# Patient Record
Sex: Female | Born: 1995 | Race: Black or African American | Hispanic: No | Smoking: Never smoker
Health system: Southern US, Community
[De-identification: ages and names within clinical notes are randomized; demographics above are authoritative.]

## PROBLEM LIST (undated history)

## (undated) ENCOUNTER — Inpatient Hospital Stay (HOSPITAL_COMMUNITY): Payer: Self-pay

## (undated) DIAGNOSIS — D649 Anemia, unspecified: Secondary | ICD-10-CM

## (undated) DIAGNOSIS — J020 Streptococcal pharyngitis: Secondary | ICD-10-CM

## (undated) HISTORY — DX: Streptococcal pharyngitis: J02.0

---

## 2017-11-29 LAB — CULTURE, OB URINE: URINE CULTURE, OB: NEGATIVE

## 2017-11-29 LAB — OB RESULTS CONSOLE RPR: RPR: NONREACTIVE

## 2017-11-29 LAB — OB RESULTS CONSOLE HIV ANTIBODY (ROUTINE TESTING): HIV: NONREACTIVE

## 2017-11-29 LAB — OB RESULTS CONSOLE HGB/HCT, BLOOD
HCT: 30 (ref 29–41)
Hemoglobin: 9.7

## 2017-11-29 LAB — OB RESULTS CONSOLE ABO/RH: RH Type: POSITIVE

## 2017-11-29 LAB — OB RESULTS CONSOLE PLATELET COUNT: Platelets: 262

## 2017-11-29 LAB — OB RESULTS CONSOLE RUBELLA ANTIBODY, IGM: Rubella: IMMUNE

## 2017-11-29 LAB — OB RESULTS CONSOLE HEPATITIS B SURFACE ANTIGEN: Hepatitis B Surface Ag: NEGATIVE

## 2017-11-29 LAB — OB RESULTS CONSOLE ANTIBODY SCREEN: Antibody Screen: NEGATIVE

## 2018-02-09 NOTE — L&D Delivery Note (Signed)
Delivery Note FHR w/some late decels, moderate variability. Cx 9/100/0. Pt encouraged to push and cx easily reduced.  A few contractions later, at 12:27 AM a viable female was delivered via Vaginal, Spontaneous (Presentation:LOA  ).  APGAR: 8/9; weight  Pending. After 1 minute, the cord was clamped and cut.  The placenta separated spontaneously and delivered via CCT and maternal pushing effort.  It was inspected and appears to be intact with a 3 VC. 40 units of pitocin diluted in 1000cc LR was infused rapidly IV. Anesthesia:  epidural Episiotomy: None Lacerations: None Suture Repair:  Est. Blood Loss (mL): 151  Mom to postpartum.  Baby to Couplet care / Skin to Skin.  Erin Montes 06/05/2018, 12:47 AM

## 2018-03-15 LAB — GLUCOSE, 1 HOUR: Glucose, GTT - 1 Hour: 95 (ref ?–200)

## 2018-03-15 LAB — OB RESULTS CONSOLE HGB/HCT, BLOOD
HCT: 27 — AB (ref 29–41)
Hemoglobin: 8.5

## 2018-03-15 LAB — OB RESULTS CONSOLE PLATELET COUNT: Platelets: 198

## 2018-05-12 ENCOUNTER — Telehealth: Payer: Self-pay | Admitting: Family Medicine

## 2018-05-12 NOTE — Telephone Encounter (Signed)
Called the patient to inform of the upcoming appointment. Left the patient a voicemail message stating to give our clinic a call back.

## 2018-05-14 ENCOUNTER — Encounter (HOSPITAL_COMMUNITY): Payer: Self-pay

## 2018-05-14 ENCOUNTER — Other Ambulatory Visit: Payer: Self-pay

## 2018-05-14 ENCOUNTER — Inpatient Hospital Stay (HOSPITAL_COMMUNITY)
Admission: EM | Admit: 2018-05-14 | Discharge: 2018-05-15 | Disposition: A | Discharge status: Home / Self Care | Payer: Medicaid - Out of State | Attending: Obstetrics and Gynecology | Admitting: Obstetrics and Gynecology

## 2018-05-14 DIAGNOSIS — Z3689 Encounter for other specified antenatal screening: Secondary | ICD-10-CM

## 2018-05-14 DIAGNOSIS — O26893 Other specified pregnancy related conditions, third trimester: Secondary | ICD-10-CM | POA: Diagnosis not present

## 2018-05-14 DIAGNOSIS — O479 False labor, unspecified: Secondary | ICD-10-CM

## 2018-05-14 DIAGNOSIS — Z3A36 36 weeks gestation of pregnancy: Secondary | ICD-10-CM | POA: Insufficient documentation

## 2018-05-14 DIAGNOSIS — R8290 Unspecified abnormal findings in urine: Secondary | ICD-10-CM | POA: Insufficient documentation

## 2018-05-14 DIAGNOSIS — O4703 False labor before 37 completed weeks of gestation, third trimester: Secondary | ICD-10-CM | POA: Insufficient documentation

## 2018-05-14 HISTORY — DX: Anemia, unspecified: D64.9

## 2018-05-14 LAB — URINALYSIS, ROUTINE W REFLEX MICROSCOPIC
Bilirubin Urine: NEGATIVE
Glucose, UA: 50 mg/dL — AB
Ketones, ur: NEGATIVE mg/dL
Nitrite: NEGATIVE
Protein, ur: NEGATIVE mg/dL
Specific Gravity, Urine: 1.015 (ref 1.005–1.030)
pH: 6 (ref 5.0–8.0)

## 2018-05-14 NOTE — MAU Provider Note (Signed)
  S: [name] is a 23 y.o. G3P2002 at [redacted]w[redacted]d  who presents to MAU today complaining contractions q ten minutes since 4pm. She endorses scant pink-tinged discharge twice today after voiding. She denies LOF. She reports normal fetal movement.    O: BP 129/82 (BP Location: Right Arm)   Pulse 83   Temp 98.4 F (36.9 C) (Oral)   Resp 16   Ht 5\' 4"  (1.626 m)   LMP 08/29/2017 (Exact Date)   SpO2 97% Comment: room air  GENERAL: Well-developed, well-nourished female in no acute distress.  HEAD: Normocephalic, atraumatic.  CHEST: Normal effort of breathing, regular heart rate ABDOMEN: Soft, nontender, gravid  Cervical exam:  Dilation: 1 Effacement (%): 70 Cervical Position: Posterior Station: Ballotable Presentation: Vertex(confirmed by u/s) Exam by:: Adah Perl RN   Fetal Monitoring: Baseline: 135 Variability: moderate Accelerations: positive 15 x 15 Decelerations: none Contractions: irregular, palpate mild   A: SIUP at [redacted]w[redacted]d  Braxton Hicks contractions Abnormal UA Normotensive  P: Discharge home with labor precautions Urine culture ordered, RN aware Records request submitted from MAU  F/U: OB appt/establish care 05/23/2018 at [facility], CNM 05/15/2018 12:10 AM

## 2018-05-14 NOTE — MAU Note (Signed)
Presents with cxts approx every 10 min since 1600 today. No LOF. Pt with bright red-pink bldg after urination at least twice today.Pos FM.   Adah Perl RN

## 2018-05-14 NOTE — Discharge Instructions (Signed)

## 2018-05-15 NOTE — MAU Note (Signed)
ROI signed to have OB records sent [facility] from [facility] in [place], [place]. Ph#[phone] Fax#[phone].  Adah Perl RN

## 2018-05-16 LAB — CULTURE, OB URINE: Culture: >=100000 — AB

## 2018-05-17 ENCOUNTER — Encounter: Payer: Self-pay | Admitting: *Deleted

## 2018-05-17 ENCOUNTER — Encounter: Payer: Self-pay | Admitting: Emergency Medicine

## 2018-05-23 ENCOUNTER — Telehealth: Payer: Self-pay | Admitting: Family Medicine

## 2018-05-23 ENCOUNTER — Encounter: Payer: Self-pay | Admitting: Family Medicine

## 2018-05-23 NOTE — Telephone Encounter (Signed)
The patient called to reschedule the appointment as she overslept. Informed we will call within 48 hours with an updated date and time.

## 2018-05-24 ENCOUNTER — Telehealth: Payer: Self-pay | Admitting: Obstetrics & Gynecology

## 2018-05-24 NOTE — Telephone Encounter (Signed)
Called the patient to inform of upcoming appointment. The patient has no questions at this time and stated she is aware of our new location.

## 2018-05-25 ENCOUNTER — Telehealth: Payer: Self-pay | Admitting: Obstetrics & Gynecology

## 2018-05-25 NOTE — Telephone Encounter (Signed)
Left the patient a detailed voicemail informing of the new location and appointment scheduled for Friday.

## 2018-05-27 ENCOUNTER — Other Ambulatory Visit: Payer: Self-pay

## 2018-05-27 ENCOUNTER — Encounter: Payer: Self-pay | Admitting: Obstetrics & Gynecology

## 2018-05-27 ENCOUNTER — Ambulatory Visit (INDEPENDENT_AMBULATORY_CARE_PROVIDER_SITE_OTHER): Payer: Medicaid - Out of State | Admitting: Obstetrics & Gynecology

## 2018-05-27 ENCOUNTER — Other Ambulatory Visit (HOSPITAL_COMMUNITY)
Admission: RE | Admit: 2018-05-27 | Discharge: 2018-05-27 | Disposition: A | Discharge status: Home / Self Care | Payer: Medicaid Other | Source: Ambulatory Visit | Attending: Obstetrics & Gynecology | Admitting: Obstetrics & Gynecology

## 2018-05-27 VITALS — BP 116/67 | HR 102 | Temp 98.4°F | Wt 184.0 lb

## 2018-05-27 DIAGNOSIS — Z3493 Encounter for supervision of normal pregnancy, unspecified, third trimester: Secondary | ICD-10-CM

## 2018-05-27 DIAGNOSIS — Z3480 Encounter for supervision of other normal pregnancy, unspecified trimester: Secondary | ICD-10-CM

## 2018-05-27 DIAGNOSIS — O34219 Maternal care for unspecified type scar from previous cesarean delivery: Secondary | ICD-10-CM | POA: Insufficient documentation

## 2018-05-27 DIAGNOSIS — Z3490 Encounter for supervision of normal pregnancy, unspecified, unspecified trimester: Secondary | ICD-10-CM

## 2018-05-27 DIAGNOSIS — Z3A38 38 weeks gestation of pregnancy: Secondary | ICD-10-CM

## 2018-05-27 MED ORDER — FERROUS SULFATE 325 (65 FE) MG PO TABS: 325.0000 mg | ORAL_TABLET | Freq: Two times a day (BID) | ORAL | 1 refills | Status: DC

## 2018-05-27 MED ORDER — DOCUSATE SODIUM 100 MG PO CAPS: 100.0000 mg | ORAL_CAPSULE | Freq: Two times a day (BID) | ORAL | 2 refills | Status: DC

## 2018-05-27 MED ORDER — BLOOD PRESSURE CUFF MISC: 1.0000 | 0 refills | Status: DC

## 2018-05-27 NOTE — Progress Notes (Signed)
   PRENATAL VISIT NOTE  Subjective:  Erin Montes is a 23 y.o. G3P2002 at [redacted]w[redacted]d being seen today for ongoing prenatal care.  Pt is a late transfer from [facility].  She is currently monitored for the following issues: anemia and has Pregnancy with history of cesarean section, antepartum on their problem list.  Patient reports no complaints.  Contractions: Irritability. Vag. Bleeding: None.  Movement: Present. Denies leaking of fluid.   The following portions of the patient's history were reviewed and updated as appropriate: allergies, current medications, past family history, past medical history, past social history, past surgical history and problem list.   Objective:   Vitals:   05/27/18 0943  BP: 116/67  Pulse: (!) 102  Temp: 98.4 F (36.9 C)  Weight: 184 lb (83.5 kg)    Fetal Status: Fetal Heart Rate (bpm): 152   Movement: Present     General:  Alert, oriented and cooperative. Patient is in no acute distress.  Skin: Skin is warm and dry. No rash noted.   Cardiovascular: Normal heart rate noted  Respiratory: Normal respiratory effort, no problems with respiration noted  Abdomen: Soft, gravid, appropriate for gestational age.  Pain/Pressure: Present     Pelvic: Cervical exam deferred        Extremities: Normal range of motion.  Edema: None  Mental Status: Normal mood and affect. Normal behavior. Normal judgment and thought content.   Assessment and Plan:  Pregnancy: G3P2002 at [redacted]w[redacted]d 1. Supervision of other normal pregnancy, antepartum - Culture, beta strep (group b only) - Cervicovaginal ancillary only( [place]) - Blood Pressure Monitoring (BLOOD PRESSURE CUFF) MISC; 1 Device by Does not apply route every 7 (seven) days. To be monitored regularly at home  ICD 10 Z34.90 Dr. Penne Lash [phone]  Dispense: 1 each; Refill: 0 -VBAC consent signed  Anemia--Hgb 8.5 at 28 week labs in [facility] Iron and colace Will redraw cbc and see if we can give IV iron   Term labor symptoms and general obstetric precautions including but not limited to vaginal bleeding, contractions, leaking of fluid and fetal movement were reviewed in detail with the patient. Please refer to After Visit Summary for other counseling recommendations.   Return in about 1 week (around 06/03/2018).  Future Appointments  Date Time Provider Department Center  06/03/2018  9:15 AM Allie Bossier, MD [facility]  06/13/2018  1:15 PM [facility]  06/13/2018  2:15 PM Allie Bossier, MD [facility] [place]    Elsie Lincoln, MD

## 2018-05-30 ENCOUNTER — Encounter (HOSPITAL_COMMUNITY): Payer: Self-pay | Admitting: *Deleted

## 2018-05-30 ENCOUNTER — Other Ambulatory Visit: Payer: Self-pay

## 2018-05-30 ENCOUNTER — Inpatient Hospital Stay (HOSPITAL_COMMUNITY)
Admission: AD | Admit: 2018-05-30 | Discharge: 2018-05-30 | Disposition: A | Discharge status: Home / Self Care | Payer: Medicaid Other | Attending: Obstetrics and Gynecology | Admitting: Obstetrics and Gynecology

## 2018-05-30 ENCOUNTER — Telehealth: Payer: Self-pay | Admitting: *Deleted

## 2018-05-30 DIAGNOSIS — O34219 Maternal care for unspecified type scar from previous cesarean delivery: Secondary | ICD-10-CM | POA: Insufficient documentation

## 2018-05-30 DIAGNOSIS — O471 False labor at or after 37 completed weeks of gestation: Secondary | ICD-10-CM | POA: Diagnosis not present

## 2018-05-30 DIAGNOSIS — B373 Candidiasis of vulva and vagina: Secondary | ICD-10-CM | POA: Insufficient documentation

## 2018-05-30 DIAGNOSIS — Z792 Long term (current) use of antibiotics: Secondary | ICD-10-CM | POA: Insufficient documentation

## 2018-05-30 DIAGNOSIS — D649 Anemia, unspecified: Secondary | ICD-10-CM | POA: Insufficient documentation

## 2018-05-30 DIAGNOSIS — B3731 Acute candidiasis of vulva and vagina: Secondary | ICD-10-CM

## 2018-05-30 DIAGNOSIS — N760 Acute vaginitis: Secondary | ICD-10-CM

## 2018-05-30 DIAGNOSIS — Z79899 Other long term (current) drug therapy: Secondary | ICD-10-CM | POA: Insufficient documentation

## 2018-05-30 DIAGNOSIS — Z3A39 39 weeks gestation of pregnancy: Secondary | ICD-10-CM | POA: Insufficient documentation

## 2018-05-30 DIAGNOSIS — O99013 Anemia complicating pregnancy, third trimester: Secondary | ICD-10-CM

## 2018-05-30 DIAGNOSIS — O479 False labor, unspecified: Secondary | ICD-10-CM | POA: Diagnosis not present

## 2018-05-30 DIAGNOSIS — Z3689 Encounter for other specified antenatal screening: Secondary | ICD-10-CM | POA: Insufficient documentation

## 2018-05-30 DIAGNOSIS — B9689 Other specified bacterial agents as the cause of diseases classified elsewhere: Secondary | ICD-10-CM

## 2018-05-30 DIAGNOSIS — O98813 Other maternal infectious and parasitic diseases complicating pregnancy, third trimester: Secondary | ICD-10-CM | POA: Insufficient documentation

## 2018-05-30 LAB — CBC
HCT: 25.6 % — ABNORMAL LOW (ref 36.0–46.0)
Hemoglobin: 7.2 g/dL — ABNORMAL LOW (ref 12.0–15.0)
MCH: 20.7 pg — ABNORMAL LOW (ref 26.0–34.0)
MCHC: 28.1 g/dL — ABNORMAL LOW (ref 30.0–36.0)
MCV: 73.8 fL — ABNORMAL LOW (ref 80.0–100.0)
Platelets: 186 10*3/uL (ref 150–400)
RBC: 3.47 MIL/uL — ABNORMAL LOW (ref 3.87–5.11)
RDW: 16.2 % — ABNORMAL HIGH (ref 11.5–15.5)
WBC: 8.5 10*3/uL (ref 4.0–10.5)
nRBC: 0.2 % (ref 0.0–0.2)

## 2018-05-30 LAB — CERVICOVAGINAL ANCILLARY ONLY
Bacterial vaginitis: POSITIVE — AB
Candida vaginitis: POSITIVE — AB
Chlamydia: NEGATIVE
Neisseria Gonorrhea: NEGATIVE
Trichomonas: NEGATIVE

## 2018-05-30 LAB — CULTURE, BETA STREP (GROUP B ONLY): Strep Gp B Culture: NEGATIVE

## 2018-05-30 MED ORDER — METRONIDAZOLE 500 MG PO TABS: 500.0000 mg | ORAL_TABLET | Freq: Two times a day (BID) | ORAL | 0 refills | Status: DC

## 2018-05-30 MED ORDER — SODIUM CHLORIDE 0.9 % IV SOLN
510.0000 mg | Freq: Once | INTRAVENOUS | Status: AC
  Administered 2018-05-30: 510 mg via INTRAVENOUS
  Filled 2018-05-30: qty 17

## 2018-05-30 MED ORDER — SODIUM CHLORIDE 0.9 % IV SOLN
INTRAVENOUS | Status: DC
  Administered 2018-05-30: 16:00:00 via INTRAVENOUS

## 2018-05-30 MED ORDER — TERCONAZOLE 0.4 % VA CREA: 1.0000 | TOPICAL_CREAM | Freq: Every day | VAGINAL | 0 refills | Status: DC

## 2018-05-30 NOTE — MAU Provider Note (Signed)
History     CSN: [id number]  Arrival date and time: 05/30/18 1333   None     Chief Complaint  Patient presents with  . Contractions   [name] is a 23 y.o. G3P2002 at [redacted]w[redacted]d who presents to MAU for ctx. Pt was originally an [employer] labor eval only until provider read telephone encounters from today regarding anemia. Pt reports she was instructed to come to MAU for her iron infusion today.  Pt is a late transfer of care from [place] and was seen in the past week in the office and found to have anemia. Last known Hgb 8.5. Per office provider note, pt to have stat CBC today, start oral iron and be scheduled for iron transfusion. When office spoke with pt, she reported ctx q63min and was told to come to MAU for evaluation. No office visit made for today for stat CBC and no mention of iron infusion scheduled. Pt reports she just learned she had anemia and denies any associated sx.  Pt reports she believes she lost her mucus plug this morning. Pt reports thick, mucus-like discharge with brown-red spots.  Pt denies VB, LOF, decreased FM, vaginal discharge/odor/itching. Pt denies N/V, abdominal pain, constipation, diarrhea, or urinary problems. Pt denies fever, chills, fatigue, sweating or changes in appetite. Pt denies SOB or chest pain. Pt denies dizziness, HA, light-headedness, weakness.  Problems this pregnancy include: hx of C/S Allergies? NKDA Current medications/supplements? PNVs only, has not yet started oral iron Prenatal care provider? [facility], next appt 06/03/2018   OB History    Gravida  3   Para  2   Term  2   Preterm      AB      Living  2     SAB      TAB      Ectopic      Multiple      Live Births  2        Obstetric Comments  Decreased heart rate         Past Medical History:  Diagnosis Date  . Anemia    not taking iron pills    Past Surgical History:  Procedure Laterality Date  . CESAREAN SECTION      History reviewed. No pertinent  family history.  Social History   Tobacco Use  . Smoking status: Never Smoker  . Smokeless tobacco: Never Used  Substance Use Topics  . Alcohol use: Not Currently  . Drug use: Not Currently    Allergies: No Known Allergies  No medications prior to admission.    Review of Systems  Constitutional: Negative for chills, diaphoresis, fatigue and fever.  Respiratory: Negative for shortness of breath.   Cardiovascular: Negative for chest pain.  Gastrointestinal: Negative for abdominal pain, constipation, diarrhea, nausea and vomiting.  Genitourinary: Positive for vaginal discharge. Negative for dysuria, flank pain, frequency, pelvic pain, urgency and vaginal bleeding.  Neurological: Negative for dizziness, weakness, light-headedness and headaches.   Physical Exam   Blood pressure 122/75, pulse 84, temperature 97.8 F (36.6 C), resp. rate 16, last menstrual period 08/29/2017, SpO2 100 %.  Patient Vitals for the past 24 hrs:  BP Temp Pulse Resp SpO2  05/30/18 1723 122/75 - 84 16 -  05/30/18 1706 122/75 - 84 - -  05/30/18 1353 98/75 97.8 F (36.6 C) 93 16 -  05/30/18 1349 - - - - 100 %    Physical Exam  Constitutional: She is oriented to person, place, and time.  She appears well-developed and well-nourished. No distress.  HENT:  Head: Normocephalic and atraumatic.  Respiratory: Effort normal.  GI: Soft.  Neurological: She is alert and oriented to person, place, and time.  Skin: Skin is warm and dry. She is not diaphoretic.  Psychiatric: She has a normal mood and affect. Her behavior is normal.   Results for orders placed or performed during the hospital encounter of 05/30/18 (from the past 24 hour(s))  CBC     Status: Abnormal   Collection Time: 05/30/18  2:22 PM  Result Value Ref Range   WBC 8.5 4.0 - 10.5 K/uL   RBC 3.47 (L) 3.87 - 5.11 MIL/uL   Hemoglobin 7.2 (L) 12.0 - 15.0 g/dL   HCT 16.125.6 (L) 09.636.0 - 04.546.0 %   MCV 73.8 (L) 80.0 - 100.0 fL   MCH 20.7 (L) 26.0 - 34.0  pg   MCHC 28.1 (L) 30.0 - 36.0 g/dL   RDW 40.916.2 (H) 81.111.5 - 91.415.5 %   Platelets 186 150 - 400 K/uL   nRBC 0.2 0.0 - 0.2 %   No results found.  MAU Course  Procedures  MDM -CBC performed, H/H 7.2/25.6 -per Dr. Earlene Plateravis, first feraheme infusion today in MAU, second in outpatient infusion center -2nd infusion scheduled for Monday, April 07, 2018 @12pm  -received call from Dr. Penne LashLeggett @1705  requesting provider give tx for +BV/+yeast on vaginal swab performed in office, RX sent  -feraheme infusion given, pt tolerated well, continues to deny sx associated with anemia after infusion -EFM: reactive, reassuring with some variables, reviewed with Gerrit HeckJessica Emly, CNM       -baseline: 140       -variability: moderate       -accels: present, 15x15       -decels: variable       -TOCO: irregular ctx -pt discharged to home in stable condition  Orders Placed This Encounter  Procedures  . CBC    Standing Status:   Standing    Number of Occurrences:   1  . Contraction - monitoring    Standing Status:   Standing    Number of Occurrences:   1  . External fetal heart monitoring    Standing Status:   Standing    Number of Occurrences:   1  . Vaginal exam    Standing Status:   Standing    Number of Occurrences:   1  . Insert peripheral IV    Standing Status:   Standing    Number of Occurrences:   1  . Discharge patient    Order Specific Question:   Discharge disposition    Answer:   01-Home or Self Care [1]    Order Specific Question:   Discharge patient date    Answer:   05/30/2018   Meds ordered this encounter  Medications  . ferumoxytol (FERAHEME) 510 mg in sodium chloride 0.9 % 100 mL IVPB  . 0.9 %  sodium chloride infusion  . terconazole (TERAZOL 7) 0.4 % vaginal cream    Sig: Place 1 applicator vaginally at bedtime for 7 days.    Dispense:  45 g    Refill:  0    Order Specific Question:   Supervising Provider    Answer:   Conan BowensDAVIS, KELLY M [[id number]]  . metroNIDAZOLE (FLAGYL) 500 MG  tablet    Sig: Take 1 tablet (500 mg total) by mouth 2 (two) times daily for 7 days.    Dispense:  14 tablet  Refill:  0    Order Specific Question:   Supervising Provider    Answer:   Conan Bowens [[id number]]    Assessment and Plan   1. False labor   2. Anemia affecting pregnancy in third trimester   3. [redacted] weeks gestation of pregnancy   4. NST (non-stress test) reactive   5. Bacterial vaginosis   6. Vaginal yeast infection    Allergies as of 05/30/2018   No Known Allergies     Medication List    TAKE these medications   Blood Pressure Cuff Misc 1 Device by Does not apply route every 7 (seven) days. To be monitored regularly at home  ICD 10 Z34.90 Dr.Kelly Leggett [phone]   docusate sodium 100 MG capsule Commonly known as:  Colace Take 1 capsule (100 mg total) by mouth 2 (two) times daily.   ferrous sulfate 325 (65 FE) MG tablet Commonly known as:  FerrouSul Take 1 tablet (325 mg total) by mouth 2 (two) times daily.   metroNIDAZOLE 500 MG tablet Commonly known as:  Flagyl Take 1 tablet (500 mg total) by mouth 2 (two) times daily for 7 days.   multivitamin-prenatal 27-0.8 MG Tabs tablet Take 1 tablet by mouth daily at 12 noon.   terconazole 0.4 % vaginal cream Commonly known as:  TERAZOL 7 Place 1 applicator vaginally at bedtime for 7 days.      -discussed impt of monitoring fetal movement and discussed FKCs daily -discussed s/sx of labor and when to return to MAU -pt aware next infusion scheduled for Monday 06/06/2018 @12pm  -pt to f/u in office as previously scheduled -return MAU precautions given -pt discharged to home in stable condition  Joni Reining E Nugent 05/30/2018, 5:54 PM

## 2018-05-30 NOTE — Telephone Encounter (Signed)
Called pt to inform her that she is anemic and that her provider recommended that she come in today for a STAT CBC, begin taking oral iron, and be set up for an iron transfusion.  Pt verbalized understanding but wanted to discuss new onset of contractions. Pt reports she believes she lost her mucous plug last night but denies any watery discharge like her water broke.  Pt reports sharp in pelvis and back and stomach gets tight.  Pt reports she is not timing the contractions but they are occurring more frequently than every 8 minutes.  Reviewed with Dr. Alysia Penna and recommendation was made for pt to go to MAU.  Pt verbalized understanding.

## 2018-05-30 NOTE — MAU Note (Signed)
Pt presents to MAU with complaints of contractions that started last night. Was called this morning by physician office and told to come in for iron infusion. Denies any VB, reports she lost her mucous plug

## 2018-05-30 NOTE — Discharge Instructions (Signed)
Vaginal Yeast infection, Adult  Vaginal yeast infection is a condition that causes vaginal discharge as well as soreness, swelling, and redness (inflammation) of the vagina. This is a common condition. Some women get this infection frequently. What are the causes? This condition is caused by a change in the normal balance of the yeast (candida) and bacteria that live in the vagina. This change causes an overgrowth of yeast, which causes the inflammation. What increases the risk? The condition is more likely to develop in women who:  Take antibiotic medicines.  Have diabetes.  Take birth control pills.  Are pregnant.  Douche often.  Have a weak body defense system (immune system).  Have been taking steroid medicines for a long time.  Frequently wear tight clothing. What are the signs or symptoms? Symptoms of this condition include:  White, thick, creamy vaginal discharge.  Swelling, itching, redness, and irritation of the vagina. The lips of the vagina (vulva) may be affected as well.  Pain or a burning feeling while urinating.  Pain during sex. How is this diagnosed? This condition is diagnosed based on:  Your medical history.  A physical exam.  A pelvic exam. Your health care provider will examine a sample of your vaginal discharge under a microscope. Your health care provider may send this sample for testing to confirm the diagnosis. How is this treated? This condition is treated with medicine. Medicines may be over-the-counter or prescription. You may be told to use one or more of the following:  Medicine that is taken by mouth (orally).  Medicine that is applied as a cream (topically).  Medicine that is inserted directly into the vagina (suppository). Follow these instructions at home:  Lifestyle  Do not have sex until your health care provider approves. Tell your sex partner that you have a yeast infection. That person should go to his or her health care  provider and ask if they should also be treated.  Do not wear tight clothes, such as pantyhose or tight pants.  Wear breathable cotton underwear. General instructions  Take or apply over-the-counter and prescription medicines only as told by your health care provider.  Eat more yogurt. This may help to keep your yeast infection from returning.  Do not use tampons until your health care provider approves.  Try taking a sitz bath to help with discomfort. This is a warm water bath that is taken while you are sitting down. The water should only come up to your hips and should cover your buttocks. Do this 3-4 times per day or as told by your health care provider.  Do not douche.  If you have diabetes, keep your blood sugar levels under control.  Keep all follow-up visits as told by your health care provider. This is important. Contact a health care provider if:  You have a fever.  Your symptoms go away and then return.  Your symptoms do not get better with treatment.  Your symptoms get worse.  You have new symptoms.  You develop blisters in or around your vagina.  You have blood coming from your vagina and it is not your menstrual period.  You develop pain in your abdomen. Summary  Vaginal yeast infection is a condition that causes discharge as well as soreness, swelling, and redness (inflammation) of the vagina.  This condition is treated with medicine. Medicines may be over-the-counter or prescription.  Take or apply over-the-counter and prescription medicines only as told by your health care provider.  Do not douche.  Do not have sex or use tampons until your health care provider approves.  Contact a health care provider if your symptoms do not get better with treatment or your symptoms go away and then return. This information is not intended to replace advice given to you by your health care provider. Make sure you discuss any questions you have with your health care  provider. Document Released: 11/05/2004 Document Revised: 06/14/2017 Document Reviewed: 06/14/2017 Elsevier Interactive Patient Education  2019 Elsevier Inc. Bacterial Vaginosis  Bacterial vaginosis is a vaginal infection that occurs when the normal balance of bacteria in the vagina is disrupted. It results from an overgrowth of certain bacteria. This is the most common vaginal infection among women ages 47-44. Because bacterial vaginosis increases your risk for STIs (sexually transmitted infections), getting treated can help reduce your risk for chlamydia, gonorrhea, herpes, and HIV (human immunodeficiency virus). Treatment is also important for preventing complications in pregnant women, because this condition can cause an early (premature) delivery. What are the causes? This condition is caused by an increase in harmful bacteria that are normally present in small amounts in the vagina. However, the reason that the condition develops is not fully understood. What increases the risk? The following factors may make you more likely to develop this condition:  Having a new sexual partner or multiple sexual partners.  Having unprotected sex.  Douching.  Having an intrauterine device (IUD).  Smoking.  Drug and alcohol abuse.  Taking certain antibiotic medicines.  Being pregnant. You cannot get bacterial vaginosis from toilet seats, bedding, swimming pools, or contact with objects around you. What are the signs or symptoms? Symptoms of this condition include:  Grey or white vaginal discharge. The discharge can also be watery or foamy.  A fish-like odor with discharge, especially after sexual intercourse or during menstruation.  Itching in and around the vagina.  Burning or pain with urination. Some women with bacterial vaginosis have no signs or symptoms. How is this diagnosed? This condition is diagnosed based on:  Your medical history.  A physical exam of the  vagina.  Testing a sample of vaginal fluid under a microscope to look for a large amount of bad bacteria or abnormal cells. Your health care provider may use a cotton swab or a small wooden spatula to collect the sample. How is this treated? This condition is treated with antibiotics. These may be given as a pill, a vaginal cream, or a medicine that is put into the vagina (suppository). If the condition comes back after treatment, a second round of antibiotics may be needed. Follow these instructions at home: Medicines  Take over-the-counter and prescription medicines only as told by your health care provider.  Take or use your antibiotic as told by your health care provider. Do not stop taking or using the antibiotic even if you start to feel better. General instructions  If you have a female sexual partner, tell her that you have a vaginal infection. She should see her health care provider and be treated if she has symptoms. If you have a female sexual partner, he does not need treatment.  During treatment: ? Avoid sexual activity until you finish treatment. ? Do not douche. ? Avoid alcohol as directed by your health care provider. ? Avoid breastfeeding as directed by your health care provider.  Drink enough water and fluids to keep your urine clear or pale yellow.  Keep the area around your vagina and rectum clean. ? Wash the area daily with  warm water. ? Wipe yourself from front to back after using the toilet.  Keep all follow-up visits as told by your health care provider. This is important. How is this prevented?  Do not douche.  Wash the outside of your vagina with warm water only.  Use protection when having sex. This includes latex condoms and dental dams.  Limit how many sexual partners you have. To help prevent bacterial vaginosis, it is best to have sex with just one partner (monogamous).  Make sure you and your sexual partner are tested for STIs.  Wear cotton or  cotton-lined underwear.  Avoid wearing tight pants and pantyhose, especially during summer.  Limit the amount of alcohol that you drink.  Do not use any products that contain nicotine or tobacco, such as cigarettes and e-cigarettes. If you need help quitting, ask your health care provider.  Do not use illegal drugs. Where to find more information  Centers for Disease Control and Prevention: SolutionApps.co.za  American Sexual Health Association (ASHA): www.ashastd.org  [place]. Department of Health and Health and safety inspector, Office on Women's Health: ConventionalMedicines.si or http://www.anderson-williamson.info/ Contact a health care provider if:  Your symptoms do not improve, even after treatment.  You have more discharge or pain when urinating.  You have a fever.  You have pain in your abdomen.  You have pain during sex.  You have vaginal bleeding between periods. Summary  Bacterial vaginosis is a vaginal infection that occurs when the normal balance of bacteria in the vagina is disrupted.  Because bacterial vaginosis increases your risk for STIs (sexually transmitted infections), getting treated can help reduce your risk for chlamydia, gonorrhea, herpes, and HIV (human immunodeficiency virus). Treatment is also important for preventing complications in pregnant women, because the condition can cause an early (premature) delivery.  This condition is treated with antibiotic medicines. These may be given as a pill, a vaginal cream, or a medicine that is put into the vagina (suppository). This information is not intended to replace advice given to you by your health care provider. Make sure you discuss any questions you have with your health care provider. Document Released: 01/26/2005 Document Revised: 06/01/2016 Document Reviewed: 10/12/2015 Elsevier Interactive Patient Education  2019 [employer]. Signs and Symptoms of Labor Labor is your body's natural process of  moving your baby, placenta, and umbilical cord out of your uterus. The process of labor usually starts when your baby is full-term, between 42 and 40 weeks of pregnancy. How will I know when I am close to going into labor? As your body prepares for labor and the birth of your baby, you may notice the following symptoms in the weeks and days before true labor starts:  Having a strong desire to get your home ready to receive your new baby. This is called nesting. Nesting may be a sign that labor is approaching, and it may occur several weeks before birth. Nesting may involve cleaning and organizing your home.  Passing a small amount of thick, bloody mucus out of your vagina (normal bloody show or losing your mucus plug). This may happen more than a week before labor begins, or it might occur right before labor begins as the opening of the cervix starts to widen (dilate). For some women, the entire mucus plug passes at once. For others, smaller portions of the mucus plug may gradually pass over several days.  Your baby moving (dropping) lower in your pelvis to get into position for birth (lightening). When this happens, you may feel  more pressure on your bladder and pelvic bone and less pressure on your ribs. This may make it easier to breathe. It may also cause you to need to urinate more often and have problems with bowel movements.  Having "practice contractions" (Braxton Hicks contractions) that occur at irregular (unevenly spaced) intervals that are more than 10 minutes apart. This is also called false labor. False labor contractions are common after exercise or sexual activity, and they will stop if you change position, rest, or drink fluids. These contractions are usually mild and do not get stronger over time. They may feel like: ? A backache or back pain. ? Mild cramps, similar to menstrual cramps. ? Tightening or pressure in your abdomen. Other early symptoms that labor may be starting soon  include:  Nausea or loss of appetite.  Diarrhea.  Having a sudden burst of energy, or feeling very tired.  Mood changes.  Having trouble sleeping. How will I know when labor has begun? Signs that true labor has begun may include:  Having contractions that come at regular (evenly spaced) intervals and increase in intensity. This may feel like more intense tightening or pressure in your abdomen that moves to your back. ? Contractions may also feel like rhythmic pain in your upper thighs or back that comes and goes at regular intervals. ? For first-time mothers, this change in intensity of contractions often occurs at a more gradual pace. ? Women who have given birth before may notice a more rapid progression of contraction changes.  Having a feeling of pressure in the vaginal area.  Your water breaking (rupture of membranes). This is when the sac of fluid that surrounds your baby breaks. When this happens, you will notice fluid leaking from your vagina. This may be clear or blood-tinged. Labor usually starts within 24 hours of your water breaking, but it may take longer to begin. ? Some women notice this as a gush of fluid. ? Others notice that their underwear repeatedly becomes damp. Follow these instructions at home:   When labor starts, or if your water breaks, call your health care provider or nurse care line. Based on your situation, they will determine when you should go in for an exam.  When you are in early labor, you may be able to rest and manage symptoms at home. Some strategies to try at home include: ? Breathing and relaxation techniques. ? Taking a warm bath or shower. ? Listening to music. ? Using a heating pad on the lower back for pain. If you are directed to use heat:  Place a towel between your skin and the heat source.  Leave the heat on for 20-30 minutes.  Remove the heat if your skin turns bright red. This is especially important if you are unable to feel  pain, heat, or cold. You may have a greater risk of getting burned. Get help right away if:  You have painful, regular contractions that are 5 minutes apart or less.  Labor starts before you are [redacted] weeks along in your pregnancy.  You have a fever.  You have a headache that does not go away.  You have bright red blood coming from your vagina.  You do not feel your baby moving.  You have a sudden onset of: ? Severe headache with vision problems. ? Nausea, vomiting, or diarrhea. ? Chest pain or shortness of breath. These symptoms may be an emergency. If your health care provider recommends that you go to the hospital  or birth center where you plan to deliver, do not drive yourself. Have someone else drive you, or call emergency services (911 in the [place] Summary  Labor is your body's natural process of moving your baby, placenta, and umbilical cord out of your uterus.  The process of labor usually starts when your baby is full-term, between 4337 and 40 weeks of pregnancy.  When labor starts, or if your water breaks, call your health care provider or nurse care line. Based on your situation, they will determine when you should go in for an exam. This information is not intended to replace advice given to you by your health care provider. Make sure you discuss any questions you have with your health care provider. Document Released: 07/03/2016 Document Revised: 07/03/2016 Document Reviewed: 07/03/2016 Elsevier Interactive Patient Education  2019 Elsevier Inc. Fetal Movement Counts Patient Name: ________________________________________________ Patient Due Date: ____________________ What is a fetal movement count?  A fetal movement count is the number of times that you feel your baby move during a certain amount of time. This may also be called a fetal kick count. A fetal movement count is recommended for every pregnant woman. You may be asked to start counting fetal movements as early as  week 28 of your pregnancy. Pay attention to when your baby is most active. You may notice your baby's sleep and wake cycles. You may also notice things that make your baby move more. You should do a fetal movement count:  When your baby is normally most active.  At the same time each day. A good time to count movements is while you are resting, after having something to eat and drink. How do I count fetal movements? 1. Find a quiet, comfortable area. Sit, or lie down on your side. 2. Write down the date, the start time and stop time, and the number of movements that you felt between those two times. Take this information with you to your health care visits. 3. For 2 hours, count kicks, flutters, swishes, rolls, and jabs. You should feel at least 10 movements during 2 hours. 4. You may stop counting after you have felt 10 movements. 5. If you do not feel 10 movements in 2 hours, have something to eat and drink. Then, keep resting and counting for 1 hour. If you feel at least 4 movements during that hour, you may stop counting. Contact a health care provider if:  You feel fewer than 4 movements in 2 hours.  Your baby is not moving like he or she usually does. Date: ____________ Start time: ____________ Stop time: ____________ Movements: ____________ Date: ____________ Start time: ____________ Stop time: ____________ Movements: ____________ Date: ____________ Start time: ____________ Stop time: ____________ Movements: ____________ Date: ____________ Start time: ____________ Stop time: ____________ Movements: ____________ Date: ____________ Start time: ____________ Stop time: ____________ Movements: ____________ Date: ____________ Start time: ____________ Stop time: ____________ Movements: ____________ Date: ____________ Start time: ____________ Stop time: ____________ Movements: ____________ Date: ____________ Start time: ____________ Stop time: ____________ Movements: ____________ Date:  ____________ Start time: ____________ Stop time: ____________ Movements: ____________ This information is not intended to replace advice given to you by your health care provider. Make sure you discuss any questions you have with your health care provider. Document Released: 02/25/2006 Document Revised: 09/25/2015 Document Reviewed: 03/07/2015 Elsevier Interactive Patient Education  2019 [employer]

## 2018-05-30 NOTE — Telephone Encounter (Signed)
-----   Message from Lesly Dukes, MD sent at 05/27/2018  1:25 PM EDT ----- Hi,  This is the new OB transfer from [place] from Friday.  Erin Montes has anemia.  We need a stat cbc on Monday and referral to the infusion center for iron infusion.  I also ordered oral iron which she can take if she is able to get IV iron.  Please let Dr. Adrian Blackwater know if you need any orders.  I am working in the [employer] and will not have access to Epic or phone calls with reliability.

## 2018-05-30 NOTE — Telephone Encounter (Signed)
Christine from [facility] calling in regards to prescription for BP cuff.  States she needs demographics and last in person or telehealth visit sent to [phone] in order for the prescription to be filled.

## 2018-05-31 ENCOUNTER — Telehealth: Payer: Self-pay | Admitting: Family Medicine

## 2018-05-31 ENCOUNTER — Telehealth: Payer: Self-pay

## 2018-05-31 MED ORDER — AMBULATORY NON FORMULARY MEDICATION: 1.0000 | 0 refills | Status: DC

## 2018-05-31 NOTE — Telephone Encounter (Signed)
Attempted to call patient to inform her of her appointment change date. Left a detailed VM about her appointment change.

## 2018-05-31 NOTE — Addendum Note (Signed)
Addended by: Ernestina Patches on: 05/31/2018 10:37 AM   Modules accepted: Orders

## 2018-06-01 NOTE — Telephone Encounter (Signed)
Pt called for results.  Pt informed of BV and yeast.  Medications e-prescribed per protocol.  Pt verbalized understanding with on further questions.

## 2018-06-02 ENCOUNTER — Ambulatory Visit (INDEPENDENT_AMBULATORY_CARE_PROVIDER_SITE_OTHER): Payer: Medicaid Other

## 2018-06-02 ENCOUNTER — Telehealth (HOSPITAL_COMMUNITY): Payer: Self-pay | Admitting: *Deleted

## 2018-06-02 ENCOUNTER — Encounter (HOSPITAL_COMMUNITY): Payer: Self-pay | Admitting: *Deleted

## 2018-06-02 ENCOUNTER — Other Ambulatory Visit: Payer: Self-pay

## 2018-06-02 DIAGNOSIS — Z3490 Encounter for supervision of normal pregnancy, unspecified, unspecified trimester: Secondary | ICD-10-CM

## 2018-06-02 DIAGNOSIS — Z3493 Encounter for supervision of normal pregnancy, unspecified, third trimester: Secondary | ICD-10-CM

## 2018-06-02 DIAGNOSIS — Z3A39 39 weeks gestation of pregnancy: Secondary | ICD-10-CM

## 2018-06-02 NOTE — Progress Notes (Signed)
   TELEHEALTH VIRTUAL OBSTETRICS VISIT ENCOUNTER NOTE  I connected with Erin Montes on 06/02/18 at 11:15 AM EDT by telephone at home and verified that I am speaking with the correct person using two identifiers.   I discussed the limitations, risks, security and privacy concerns of performing an evaluation and management service by telephone and the availability of in person appointments. I also discussed with the patient that there may be a patient responsible charge related to this service. The patient expressed understanding and agreed to proceed.  Subjective:  Erin Montes is a 23 y.o. G3P2002 at [redacted]w[redacted]d being followed for ongoing prenatal care.  She is currently monitored for the following issues for this low-risk pregnancy and has Pregnancy with history of cesarean section, antepartum on their problem list.  Patient reports no complaints. Reports fetal movement. Denies any contractions, bleeding or leaking of fluid.   The following portions of the patient's history were reviewed and updated as appropriate: allergies, current medications, past family history, past medical history, past social history, past surgical history and problem list.   Objective:   General:  Alert, oriented and cooperative.   Mental Status: Normal mood and affect perceived. Normal judgment and thought content.  Rest of physical exam deferred due to type of encounter  Assessment and Plan:  Pregnancy: G3P2002 at [redacted]w[redacted]d 1. Supervision of low-risk pregnancy, unspecified trimester -Patient does not have BP cuff -No complaints, routine care -Next visit in person for postdates testing -IOL at 41 weeks  Term labor symptoms and general obstetric precautions including but not limited to vaginal bleeding, contractions, leaking of fluid and fetal movement were reviewed in detail with the patient.  I discussed the assessment and treatment plan with the patient. The patient was provided an opportunity to ask questions  and all were answered. The patient agreed with the plan and demonstrated an understanding of the instructions. The patient was advised to call back or seek an in-person office evaluation/go to [facility] at [facility] for any urgent or concerning symptoms. Please refer to After Visit Summary for other counseling recommendations.   I provided 9 minutes of non-face-to-face time during this encounter.  Return in about 1 week (around 06/09/2018).  Future Appointments  Date Time Provider Department Center  06/06/2018 12:00 PM [facility] ROOM 9 [facility] None  06/13/2018  1:15 PM [facility] NST [facility] WOC  06/13/2018  2:15 PM Stinson, Rhona Raider, DO [facility] WOC    Rolm Bookbinder, CNM [facility] for [employer], [facility]

## 2018-06-02 NOTE — Progress Notes (Signed)
I connected with  Macy Mis on 06/02/18 at 11:15 AM EDT by telephone and verified that I am speaking with the correct person using two identifiers.   I discussed the limitations, risks, security and privacy concerns of performing an evaluation and management service by telephone and the availability of in person appointments. I also discussed with the patient that there may be a patient responsible charge related to this service. The patient expressed understanding and agreed to proceed.  Janene Madeira Arnet Hofferber, CMA 06/02/2018  11:02 AM    As of today still no cuff from [place] [facility] will follow up

## 2018-06-02 NOTE — Telephone Encounter (Signed)
Preadmission screen  

## 2018-06-03 ENCOUNTER — Encounter: Payer: Medicaid - Out of State | Admitting: Obstetrics & Gynecology

## 2018-06-04 ENCOUNTER — Encounter (HOSPITAL_COMMUNITY): Payer: Self-pay | Admitting: *Deleted

## 2018-06-04 ENCOUNTER — Inpatient Hospital Stay (HOSPITAL_COMMUNITY): Payer: Medicaid Other | Admitting: Anesthesiology

## 2018-06-04 ENCOUNTER — Other Ambulatory Visit: Payer: Self-pay

## 2018-06-04 ENCOUNTER — Inpatient Hospital Stay (HOSPITAL_COMMUNITY)
Admission: AD | Admit: 2018-06-04 | Discharge: 2018-06-04 | Disposition: A | Discharge status: Home / Self Care | Payer: Medicaid Other | Source: Home / Self Care | Attending: Obstetrics and Gynecology | Admitting: Obstetrics and Gynecology

## 2018-06-04 ENCOUNTER — Inpatient Hospital Stay (EMERGENCY_DEPARTMENT_HOSPITAL)
Admission: AD | Admit: 2018-06-04 | Discharge: 2018-06-04 | Disposition: A | Discharge status: Home / Self Care | Payer: Medicaid Other | Source: Ambulatory Visit | Attending: Obstetrics & Gynecology | Admitting: Obstetrics & Gynecology

## 2018-06-04 ENCOUNTER — Inpatient Hospital Stay (HOSPITAL_COMMUNITY)
Admission: AD | Admit: 2018-06-04 | Discharge: 2018-06-06 | DRG: 807 | Disposition: A | Discharge status: Home / Self Care | Payer: Medicaid Other | Attending: Obstetrics and Gynecology | Admitting: Obstetrics and Gynecology

## 2018-06-04 DIAGNOSIS — O471 False labor at or after 37 completed weeks of gestation: Secondary | ICD-10-CM

## 2018-06-04 DIAGNOSIS — O4292 Full-term premature rupture of membranes, unspecified as to length of time between rupture and onset of labor: Principal | ICD-10-CM | POA: Diagnosis present

## 2018-06-04 DIAGNOSIS — O9902 Anemia complicating childbirth: Secondary | ICD-10-CM | POA: Diagnosis present

## 2018-06-04 DIAGNOSIS — O34219 Maternal care for unspecified type scar from previous cesarean delivery: Secondary | ICD-10-CM | POA: Diagnosis present

## 2018-06-04 DIAGNOSIS — O4290 Premature rupture of membranes, unspecified as to length of time between rupture and onset of labor, unspecified weeks of gestation: Secondary | ICD-10-CM | POA: Diagnosis present

## 2018-06-04 DIAGNOSIS — O479 False labor, unspecified: Secondary | ICD-10-CM

## 2018-06-04 DIAGNOSIS — Z3A39 39 weeks gestation of pregnancy: Secondary | ICD-10-CM

## 2018-06-04 DIAGNOSIS — D649 Anemia, unspecified: Secondary | ICD-10-CM | POA: Diagnosis present

## 2018-06-04 DIAGNOSIS — Z98891 History of uterine scar from previous surgery: Secondary | ICD-10-CM

## 2018-06-04 LAB — CBC
HCT: 27.5 % — ABNORMAL LOW (ref 36.0–46.0)
Hemoglobin: 7.9 g/dL — ABNORMAL LOW (ref 12.0–15.0)
MCH: 22 pg — ABNORMAL LOW (ref 26.0–34.0)
MCHC: 28.7 g/dL — ABNORMAL LOW (ref 30.0–36.0)
MCV: 76.6 fL — ABNORMAL LOW (ref 80.0–100.0)
Platelets: 185 10*3/uL (ref 150–400)
RBC: 3.59 MIL/uL — ABNORMAL LOW (ref 3.87–5.11)
RDW: 18.4 % — ABNORMAL HIGH (ref 11.5–15.5)
WBC: 9.9 10*3/uL (ref 4.0–10.5)
nRBC: 0.6 % — ABNORMAL HIGH (ref 0.0–0.2)

## 2018-06-04 LAB — ABO/RH: ABO/RH(D): A POS

## 2018-06-04 LAB — TYPE AND SCREEN
ABO/RH(D): A POS
Antibody Screen: NEGATIVE

## 2018-06-04 LAB — POCT FERN TEST: POCT Fern Test: POSITIVE

## 2018-06-04 MED ORDER — PHENYLEPHRINE 40 MCG/ML (10ML) SYRINGE FOR IV PUSH (FOR BLOOD PRESSURE SUPPORT): 80.0000 ug | PREFILLED_SYRINGE | INTRAVENOUS | Status: DC | PRN

## 2018-06-04 MED ORDER — ACETAMINOPHEN 325 MG PO TABS: 650.0000 mg | ORAL_TABLET | ORAL | Status: DC | PRN

## 2018-06-04 MED ORDER — TERBUTALINE SULFATE 1 MG/ML IJ SOLN: 0.2500 mg | Freq: Once | INTRAMUSCULAR | Status: DC | PRN

## 2018-06-04 MED ORDER — FLEET ENEMA 7-19 GM/118ML RE ENEM: 1.0000 | ENEMA | RECTAL | Status: DC | PRN

## 2018-06-04 MED ORDER — OXYTOCIN 40 UNITS IN NORMAL SALINE INFUSION - SIMPLE MED
1.0000 m[IU]/min | INTRAVENOUS | Status: DC
  Administered 2018-06-04: 2 m[IU]/min via INTRAVENOUS

## 2018-06-04 MED ORDER — LIDOCAINE HCL (PF) 1 % IJ SOLN: 30.0000 mL | INTRAMUSCULAR | Status: DC | PRN

## 2018-06-04 MED ORDER — LACTATED RINGERS IV SOLN: 500.0000 mL | Freq: Once | INTRAVENOUS | Status: DC

## 2018-06-04 MED ORDER — OXYTOCIN BOLUS FROM INFUSION
500.0000 mL | Freq: Once | INTRAVENOUS | Status: AC
  Administered 2018-06-05: 01:00:00 500 mL via INTRAVENOUS

## 2018-06-04 MED ORDER — FENTANYL-BUPIVACAINE-NACL 0.5-0.125-0.9 MG/250ML-% EP SOLN
EPIDURAL | Status: AC
  Filled 2018-06-04: qty 250

## 2018-06-04 MED ORDER — ONDANSETRON HCL 4 MG/2ML IJ SOLN: 4.0000 mg | Freq: Four times a day (QID) | INTRAMUSCULAR | Status: DC | PRN

## 2018-06-04 MED ORDER — LACTATED RINGERS IV SOLN
500.0000 mL | Freq: Once | INTRAVENOUS | Status: AC
  Administered 2018-06-04: 21:00:00 500 mL via INTRAVENOUS

## 2018-06-04 MED ORDER — OXYTOCIN 40 UNITS IN NORMAL SALINE INFUSION - SIMPLE MED
2.5000 [IU]/h | INTRAVENOUS | Status: DC
  Filled 2018-06-04: qty 1000

## 2018-06-04 MED ORDER — EPHEDRINE 5 MG/ML INJ: 10.0000 mg | INTRAVENOUS | Status: DC | PRN

## 2018-06-04 MED ORDER — FENTANYL-BUPIVACAINE-NACL 0.5-0.125-0.9 MG/250ML-% EP SOLN: 12.0000 mL/h | EPIDURAL | Status: DC | PRN

## 2018-06-04 MED ORDER — LIDOCAINE HCL (PF) 1 % IJ SOLN
INTRAMUSCULAR | Status: DC | PRN
  Administered 2018-06-04 (×2): 5 mL via EPIDURAL

## 2018-06-04 MED ORDER — PHENYLEPHRINE 40 MCG/ML (10ML) SYRINGE FOR IV PUSH (FOR BLOOD PRESSURE SUPPORT)
PREFILLED_SYRINGE | INTRAVENOUS | Status: AC
  Filled 2018-06-04: qty 10

## 2018-06-04 MED ORDER — SOD CITRATE-CITRIC ACID 500-334 MG/5ML PO SOLN: 30.0000 mL | ORAL | Status: DC | PRN

## 2018-06-04 MED ORDER — OXYCODONE-ACETAMINOPHEN 5-325 MG PO TABS: 1.0000 | ORAL_TABLET | ORAL | Status: DC | PRN

## 2018-06-04 MED ORDER — LACTATED RINGERS IV SOLN
INTRAVENOUS | Status: DC
  Administered 2018-06-04 (×3): via INTRAVENOUS

## 2018-06-04 MED ORDER — SODIUM CHLORIDE (PF) 0.9 % IJ SOLN
INTRAMUSCULAR | Status: DC | PRN
  Administered 2018-06-04: 12 mL/h via EPIDURAL

## 2018-06-04 MED ORDER — LACTATED RINGERS IV SOLN: 500.0000 mL | INTRAVENOUS | Status: DC | PRN

## 2018-06-04 MED ORDER — OXYCODONE-ACETAMINOPHEN 5-325 MG PO TABS: 2.0000 | ORAL_TABLET | ORAL | Status: DC | PRN

## 2018-06-04 MED ORDER — DIPHENHYDRAMINE HCL 50 MG/ML IJ SOLN: 12.5000 mg | INTRAMUSCULAR | Status: DC | PRN

## 2018-06-04 NOTE — Anesthesia Procedure Notes (Signed)
Epidural Patient location during procedure: OB Start time: 06/04/2018 8:55 PM End time: 06/04/2018 9:05 PM  Staffing Anesthesiologist: Achille Rich, MD Performed: anesthesiologist   Preanesthetic Checklist Completed: patient identified, site marked, pre-op evaluation, timeout performed, IV checked, risks and benefits discussed and monitors and equipment checked  Epidural Patient position: sitting Prep: DuraPrep Patient monitoring: heart rate, cardiac monitor, continuous pulse ox and blood pressure Approach: midline Location: L2-L3 Injection technique: LOR saline  Needle:  Needle type: Tuohy  Needle gauge: 17 G Needle length: 9 cm Needle insertion depth: 5 cm Catheter type: closed end flexible Catheter size: 19 Gauge Catheter at skin depth: 12 cm Test dose: negative and Other  Assessment Events: blood not aspirated, injection not painful, no injection resistance and negative IV test  Additional Notes Informed consent obtained prior to proceeding including risk of failure, 1% risk of PDPH, risk of minor discomfort and bruising.  Discussed rare but serious complications including epidural abscess, permanent nerve injury, epidural hematoma.  Discussed alternatives to epidural analgesia and patient desires to proceed.  Timeout performed pre-procedure verifying patient name, procedure, and platelet count.  Patient tolerated procedure well. Reason for block:procedure for pain

## 2018-06-04 NOTE — MAU Note (Signed)
Erin Montes is a 23 y.o. at [redacted]w[redacted]d here in MAU reporting: LOF about 30 min ago, was clear, still feels some leaking but not wearing a pad. Reports contractions that are more painful then earlier but are a little bit less frequent. Some decreased fetal movement since last time being here.  Onset of complaint: 30 min  Pain score: 9/10  Vitals:   06/04/18 1422  BP: 115/80  Pulse: 95  Resp: 20  Temp: 99 F (37.2 C)  SpO2: 95%     FHT:164  Lab orders placed from triage: none

## 2018-06-04 NOTE — MAU Provider Note (Signed)
RN Labor Evaluation  Patient presents to MAU with contractions. States contractions increasing in frequency. Denies leakage of fluids, reports normal movement. Was 1/thick/ballotable about 4 days ago. Changed from 1.5 to 3 while being monitored in MAU for about 2 hours. No significant change in cervical exam over last hour and contractions starting to space out. Reactive and reassuring fetal tracing - 130s/mod/+a, occasional variable decelerations early in tracing but resolved spontaneously, contractions every 5-8 minutes. Likely in early labor, stable for discharge home. Has induction scheduled 06/12/18 but likely will continue into active labor prior to that time. Lives close to hospital, labor precautions reviewed prior to discharge.   Cristal Deer. Earlene Plater, DO OB/GYN Fellow

## 2018-06-04 NOTE — MAU Provider Note (Signed)
Today's Vitals   06/04/18 0712 06/04/18 0714  BP:  125/61  Pulse:  99  Resp:  20  SpO2:  100%  PainSc: 9     There is no height or weight on file to calculate BMI. Pt in for labor eval. Ctx q 3-7 minutes. Cx 3-4/50/-3 X2 checks. NST: FHR baseline 145 bpm, Variability: moderate, Accelerations:present, Decelerations:  Absent= Cat 1/Reactive.  DC'd w/instructions to come back when ctx get more regular/closer together/stronger.

## 2018-06-04 NOTE — MAU Note (Signed)
Pt reports to MAU c/o ctx every 5 min with bloody show. No LOF. +FM.

## 2018-06-04 NOTE — Discharge Instructions (Signed)

## 2018-06-04 NOTE — Anesthesia Preprocedure Evaluation (Signed)
Anesthesia Evaluation  Patient identified by MRN, date of birth, ID band Patient awake    Reviewed: Allergy & Precautions, H&P , NPO status , Patient's Chart, lab work & pertinent test results  Airway Mallampati: II   Neck ROM: full    Dental   Pulmonary neg pulmonary ROS,    breath sounds clear to auscultation       Cardiovascular negative cardio ROS   Rhythm:regular Rate:Normal     Neuro/Psych    GI/Hepatic   Endo/Other    Renal/GU      Musculoskeletal   Abdominal   Peds  Hematology  (+) Blood dyscrasia, anemia ,   Anesthesia Other Findings   Reproductive/Obstetrics (+) Pregnancy                             Anesthesia Physical Anesthesia Plan  ASA: II  Anesthesia Plan: Epidural   Post-op Pain Management:    Induction: Intravenous  PONV Risk Score and Plan: 2 and Treatment may vary due to age or medical condition  Airway Management Planned: Natural Airway  Additional Equipment:   Intra-op Plan:   Post-operative Plan:   Informed Consent: I have reviewed the patients History and Physical, chart, labs and discussed the procedure including the risks, benefits and alternatives for the proposed anesthesia with the patient or authorized representative who has indicated his/her understanding and acceptance.       Plan Discussed with: Anesthesiologist  Anesthesia Plan Comments:         Anesthesia Quick Evaluation  

## 2018-06-04 NOTE — H&P (Signed)
Erin Montes is a 23 y.o. female [phone] with IUP at 19w6dpresenting for ROM a few hours ago.  Had been to MAU this am w/ctx, cx still the same as then. Ctx have decreased in frequencty.  Membranes are ruptured, clear fluid, with active fetal movement.  [place]   Prenatal History/Complications: Anemia, got Ferraheme on 4/20.    Past Medical History: Past Medical History:  Diagnosis Date  . Anemia    not taking iron pills    Past Surgical History: Past Surgical History:  Procedure Laterality Date  . CESAREAN SECTION      Obstetrical History: OB History    Gravida  3   Para  2   Term  2   Preterm      AB      Living  2     SAB      TAB      Ectopic      Multiple      Live Births  2        Obstetric Comments  Decreased heart rate          Social History: Social History   Socioeconomic History  . Marital status: Unknown    Spouse name: Not on file  . Number of children: Not on file  . Years of education: Not on file  . Highest education level: Not on file  Occupational History  . Not on file  Social Needs  . Financial resource strain: Not hard at all  . Food insecurity:    Worry: Never true    Inability: Never true  . Transportation needs:    Medical: No    Non-medical: Not on file  Tobacco Use  . Smoking status: Never Smoker  . Smokeless tobacco: Never Used  Substance and Sexual Activity  . Alcohol use: Not Currently  . Drug use: Not Currently  . Sexual activity: Not Currently    Birth control/protection: None    Comment: last IC-10/2017  Lifestyle  . Physical activity:    Days per week: Not on file    Minutes per session: Not on file  . Stress: Not at all  Relationships  . Social connections:    Talks on phone: Not on file    Gets together: Not on file    Attends religious service: Not on file    Active member of club or organization: Not on file    Attends meetings of clubs or  organizations: Not on file    Relationship status: Not on file  Other Topics Concern  . Not on file  Social History Narrative   .cwhte    Family History: History reviewed. No pertinent family history.  Allergies: No Known Allergies  Medications Prior to Admission  Medication Sig Dispense Refill Last Dose  . docusate sodium (COLACE) 100 MG capsule Take 1 capsule (100 mg total) by mouth 2 (two) times daily. 60 capsule 2 06/03/2018 at Unknown time  . ferrous sulfate (FERROUSUL) 325 (65 FE) MG tablet Take 1 tablet (325 mg total) by mouth 2 (two) times daily. 60 tablet 1 06/03/2018 at Unknown time  . Prenatal Vit-Fe Fumarate-FA (MULTIVITAMIN-PRENATAL) 27-0.8 MG TABS tablet Take 1 tablet by mouth daily at 12 noon.   06/04/2018 at Unknown time  . AMBULATORY NON FORMULARY MEDICATION 1 Device by Other route once a week. Blood Pressure Cuff/ Medium  Monitored regularly at home Z34.90 1 kit 0   . Blood  Pressure Monitoring (BLOOD PRESSURE CUFF) MISC 1 Device by Does not apply route every 7 (seven) days. To be monitored regularly at home  ICD 10 Z34.90 Dr.Kelly Gala Romney [phone] 1 each 0 05/30/2018 at Unknown time  . metroNIDAZOLE (FLAGYL) 500 MG tablet Take 1 tablet (500 mg total) by mouth 2 (two) times daily for 7 days. 14 tablet 0 06/03/2018 at Unknown time  . terconazole (TERAZOL 7) 0.4 % vaginal cream Place 1 applicator vaginally at bedtime for 7 days. 45 g 0 06/03/2018 at Unknown time        Review of Systems   Constitutional: Negative for fever and chills Eyes: Negative for visual disturbances Respiratory: Negative for shortness of breath, dyspnea Cardiovascular: Negative for chest pain or palpitations  Gastrointestinal: Negative for vomiting, diarrhea and constipation.  POSITIVE for abdominal pain (contractions) Genitourinary: Negative for dysuria and urgency Musculoskeletal: Negative for back pain, joint pain, myalgias  Neurological: Negative for dizziness and  headaches      Blood pressure 103/61, pulse 60, temperature 98.2 F (36.8 C), temperature source Oral, resp. rate 16, height '5\' 4"'  (1.626 m), weight 85.3 kg, last menstrual period 08/29/2017, SpO2 95 %. General appearance: alert, cooperative and no distress Lungs: clear to auscultation bilaterally Heart: regular rate and rhythm Abdomen: soft, non-tender; bowel sounds normal Extremities: Homans sign is negative, no sign of DVT DTR's 2+ Presentation: cephalic Fetal monitoring  Baseline: 145 bpm, Variability: Good {> 6 bpm), Accelerations: Reactive and Decelerations: Absent Uterine activity  3-4 minutes, mild Dilation: 3.5 Effacement (%): 70 Station: -2 Exam by:: Jannifer Franklin, RN   Prenatal labs: ABO, Rh: --/--/PENDING (04/25 1515) Antibody: PENDING (04/25 1515) Rubella: Immune (10/21 0000) RPR: Nonreactive (10/21 0000)  HBsAg: Negative (10/21 0000)  HIV: Non-reactive (10/21 0000)  GBS:   neg   Prenatal Transfer Tool  Maternal Diabetes: No Genetic Screening: Normal Maternal Ultrasounds/Referrals: Normal Fetal Ultrasounds or other Referrals:  None Maternal Substance Abuse:  No Significant Maternal Medications:  None Significant Maternal Lab Results: Lab values include: Group B Strep negative     Results for orders placed or performed during the hospital encounter of 06/04/18 (from the past 24 hour(s))  POCT fern test   Collection Time: 06/04/18  2:45 PM  Result Value Ref Range   POCT Fern Test Positive = ruptured amniotic membanes   Type and screen   Collection Time: 06/04/18  3:15 PM  Result Value Ref Range   ABO/RH(D) PENDING    Antibody Screen PENDING    Sample Expiration      06/07/2018 Performed at [facility], [address], [place], [place] [address]   CBC   Collection Time: 06/04/18  3:18 PM  Result Value Ref Range   WBC 9.9 4.0 - 10.5 K/uL   RBC 3.59 (L) 3.87 - 5.11 MIL/uL   Hemoglobin 7.9 (L) 12.0 - 15.0 g/dL   HCT 27.5 (L) 36.0 - 46.0 %    MCV 76.6 (L) 80.0 - 100.0 fL   MCH 22.0 (L) 26.0 - 34.0 pg   MCHC 28.7 (L) 30.0 - 36.0 g/dL   RDW 18.4 (H) 11.5 - 15.5 %   Platelets 185 150 - 400 K/uL   nRBC 0.6 (H) 0.0 - 0.2 %   Nursing Staff Provider  Office Location  [facility] Dating  LMP c/w 10 weeks [place] in [place]  Language  English Anatomy US  Nml in [place]  Flu [place] negative, AFP negative   TDaP vaccine  2/20 (in [place]) Hgb A1C or  GTT One hour = 95  Rhogam     LAB RESULTS   Feeding Plan Breast Blood Type A/Positive/-- (10/21 0000)   Contraception unsure Antibody Negative (10/21 0000)  Circumcision yes Rubella Immune (10/21 0000)  Pediatrician   RPR Nonreactive (10/21 0000)   Support Person FOB HBsAg Negative (10/21 0000)   Prenatal Classes N/A HIV Non-reactive (10/21 0000)  BTL Consent  GBS  Negative  VBAC Consent signed Pap     Hgb Electro      CF     SMA     Waterbirth     Assessment: Erin Montes is a 23 y.o. [id number] with an IUP at 74w6dpresenting for ROM/early labor  Plan: #Labor: requests pitocin augmentation #Pain:  epidural #FWB Cat 1 #ID: GBS: neg  #MOF:  breast #MOC: unsure #Circ: outpt   FChristin Fudge4/25/2020, 4:03 PM

## 2018-06-04 NOTE — Progress Notes (Addendum)
Vitals:   06/04/18 2235 06/04/18 2300  BP: (!) 100/59 122/67  Pulse: 72 61  Resp: 16 16  Temp:    SpO2:     Comfortable w/epidural. Has been on pitocin for several hours d/t ctx being irregular and mild, now at 14 mu/min. Ctx q 2-4 minutes.  Cx 8/100-2.  AROM forebag. FHR 150, moderate variability, accels +, a few late decels that improved w/position change. Anticipate delivery soon. Marland Kitchen

## 2018-06-04 NOTE — MAU Note (Signed)
Erin Montes is a 23 y.o. at [redacted]w[redacted]d here in MAU reporting: was just discharge, states since discharge her contractions feel closer and she saw blood on her bed, states it was a spot on her bed and then saw blood in the toilet. No LOF, + FM  Onset of complaint: ongoing  Pain score: 9/10  Vitals:   06/04/18 0714  BP: 125/61  Pulse: 99  Resp: 20  SpO2: 100%     FHT:+ FM  Lab orders placed from triage: none

## 2018-06-05 ENCOUNTER — Encounter (HOSPITAL_COMMUNITY): Payer: Self-pay | Admitting: *Deleted

## 2018-06-05 DIAGNOSIS — Z3A39 39 weeks gestation of pregnancy: Secondary | ICD-10-CM

## 2018-06-05 DIAGNOSIS — O4290 Premature rupture of membranes, unspecified as to length of time between rupture and onset of labor, unspecified weeks of gestation: Secondary | ICD-10-CM | POA: Diagnosis present

## 2018-06-05 LAB — RPR: RPR Ser Ql: NONREACTIVE

## 2018-06-05 MED ORDER — FLEET ENEMA 7-19 GM/118ML RE ENEM: 1.0000 | ENEMA | Freq: Every day | RECTAL | Status: DC | PRN

## 2018-06-05 MED ORDER — ONDANSETRON HCL 4 MG/2ML IJ SOLN: 4.0000 mg | INTRAMUSCULAR | Status: DC | PRN

## 2018-06-05 MED ORDER — SIMETHICONE 80 MG PO CHEW: 80.0000 mg | CHEWABLE_TABLET | ORAL | Status: DC | PRN

## 2018-06-05 MED ORDER — MEASLES, MUMPS & RUBELLA VAC IJ SOLR: 0.5000 mL | Freq: Once | INTRAMUSCULAR | Status: DC

## 2018-06-05 MED ORDER — PRENATAL MULTIVITAMIN CH
1.0000 | ORAL_TABLET | Freq: Every day | ORAL | Status: DC
  Administered 2018-06-05 – 2018-06-06 (×2): 1 via ORAL
  Filled 2018-06-05 (×2): qty 1

## 2018-06-05 MED ORDER — WITCH HAZEL-GLYCERIN EX PADS: 1.0000 "application " | MEDICATED_PAD | CUTANEOUS | Status: DC | PRN

## 2018-06-05 MED ORDER — DIPHENHYDRAMINE HCL 25 MG PO CAPS: 25.0000 mg | ORAL_CAPSULE | Freq: Four times a day (QID) | ORAL | Status: DC | PRN

## 2018-06-05 MED ORDER — DIBUCAINE (PERIANAL) 1 % EX OINT: 1.0000 "application " | TOPICAL_OINTMENT | CUTANEOUS | Status: DC | PRN

## 2018-06-05 MED ORDER — ONDANSETRON HCL 4 MG PO TABS: 4.0000 mg | ORAL_TABLET | ORAL | Status: DC | PRN

## 2018-06-05 MED ORDER — BENZOCAINE-MENTHOL 20-0.5 % EX AERO
1.0000 "application " | INHALATION_SPRAY | CUTANEOUS | Status: DC | PRN
  Administered 2018-06-05: 1 via TOPICAL
  Filled 2018-06-05: qty 56

## 2018-06-05 MED ORDER — COCONUT OIL OIL: 1.0000 "application " | TOPICAL_OIL | Status: DC | PRN

## 2018-06-05 MED ORDER — IBUPROFEN 600 MG PO TABS
600.0000 mg | ORAL_TABLET | Freq: Four times a day (QID) | ORAL | Status: DC
  Administered 2018-06-05 – 2018-06-06 (×6): 600 mg via ORAL
  Filled 2018-06-05 (×6): qty 1

## 2018-06-05 MED ORDER — ACETAMINOPHEN 325 MG PO TABS: 650.0000 mg | ORAL_TABLET | ORAL | Status: DC | PRN

## 2018-06-05 MED ORDER — METHYLERGONOVINE MALEATE 0.2 MG PO TABS: 0.2000 mg | ORAL_TABLET | ORAL | Status: DC | PRN

## 2018-06-05 MED ORDER — ZOLPIDEM TARTRATE 5 MG PO TABS: 5.0000 mg | ORAL_TABLET | Freq: Every evening | ORAL | Status: DC | PRN

## 2018-06-05 MED ORDER — METHYLERGONOVINE MALEATE 0.2 MG/ML IJ SOLN: 0.2000 mg | INTRAMUSCULAR | Status: DC | PRN

## 2018-06-05 MED ORDER — SODIUM CHLORIDE 0.9 % IV SOLN
510.0000 mg | Freq: Once | INTRAVENOUS | Status: AC
  Administered 2018-06-05: 510 mg via INTRAVENOUS
  Filled 2018-06-05 (×2): qty 17

## 2018-06-05 MED ORDER — BISACODYL 10 MG RE SUPP: 10.0000 mg | Freq: Every day | RECTAL | Status: DC | PRN

## 2018-06-05 MED ORDER — TETANUS-DIPHTH-ACELL PERTUSSIS 5-2.5-18.5 LF-MCG/0.5 IM SUSP: 0.5000 mL | Freq: Once | INTRAMUSCULAR | Status: DC

## 2018-06-05 MED ORDER — DOCUSATE SODIUM 100 MG PO CAPS
100.0000 mg | ORAL_CAPSULE | Freq: Two times a day (BID) | ORAL | Status: DC
  Administered 2018-06-05 – 2018-06-06 (×2): 100 mg via ORAL
  Filled 2018-06-05 (×2): qty 1

## 2018-06-05 MED ORDER — SODIUM CHLORIDE 0.9 % IV SOLN: 510.0000 mg | Freq: Once | INTRAVENOUS | Status: DC

## 2018-06-05 NOTE — Anesthesia Postprocedure Evaluation (Signed)
Anesthesia Post Note  Patient: Macy Mis  Procedure(s) Performed: AN AD HOC LABOR EPIDURAL     Patient location during evaluation: Mother Baby Anesthesia Type: Epidural Level of consciousness: awake and alert Pain management: pain level controlled Vital Signs Assessment: post-procedure vital signs reviewed and stable Respiratory status: spontaneous breathing, nonlabored ventilation and respiratory function stable Cardiovascular status: stable Postop Assessment: no headache, no backache and epidural receding Anesthetic complications: no    Last Vitals:  Vitals:   06/05/18 0325 06/05/18 0730  BP: 119/67 110/68  Pulse: 80 78  Resp: 16 18  Temp: 36.9 C 37.2 C  SpO2: 100%     Last Pain:  Vitals:   06/05/18 0730  TempSrc: Oral  PainSc: 0-No pain   Pain Goal:                Epidural/Spinal Function Cutaneous sensation: Normal sensation (06/05/18 0730), Patient able to flex knees: Yes (06/05/18 0730), Patient able to lift hips off bed: Yes (06/05/18 0730), Back pain beyond tenderness at insertion site: No (06/05/18 0730), Progressively worsening motor and/or sensory loss: No (06/05/18 0730), Bowel and/or bladder incontinence post epidural: No (06/05/18 0730)  Marquette Saa

## 2018-06-05 NOTE — Discharge Summary (Signed)
Postpartum Discharge Summary     Patient Name: Erin Montes DOB: [birth year] MRN: [id number]  Date of admission: 06/04/2018 Delivering Provider: Jacklyn Shell   Date of discharge: 06/05/2018  Admitting diagnosis: WATER BROKE  Intrauterine pregnancy: [redacted]w[redacted]d     Secondary diagnosis:  Active Problems:   Pregnancy with history of cesarean section, antepartum  Additional problems: PROM     Discharge diagnosis: VBAC                                                                                                Post partum procedures:Iron infusion 06/05/18  Augmentation: Pitocin  Complications: None  Hospital course:  Onset of Labor With Vaginal Delivery     23 y.o. yo [id number] at [redacted]w[redacted]d was admitted in Latent Labor on 06/04/2018. Her water broke and ctx were irregular and mild. Augmented with Pitocin, AROM of forebag with light meconium prior to delivery. Patient had an uncomplicated labor course as follows:  Membrane Rupture Time/Date: 2:00 PM ,06/04/2018   Intrapartum Procedures: Episiotomy: None [1]                                         Lacerations:  None [1]  Patient had a delivery of a Viable infant. 06/05/2018  Information for the patient's newborn:  Tamitra, Bray [[id number]]  Delivery Method: Vaginal, Spontaneous(Filed from Delivery Summary)    Pateint had an uncomplicated postpartum course.  She is ambulating, tolerating a regular diet, passing flatus, and urinating well. Patient is discharged home in stable condition on 06/05/18.   Magnesium Sulfate recieved: No BMZ received: No  Physical exam  Vitals:   06/04/18 2235 06/04/18 2300 06/04/18 2330 06/05/18 0000  BP: (!) 100/59 122/67 135/86 116/64  Pulse: 72 61 87 63  Resp: 16 16 16    Temp:    98.1 F (36.7 C)  TempSrc:    Oral  SpO2:      Weight:      Height:       General: alert, cooperative and no distress Lochia: appropriate Uterine Fundus: firm Incision: N/A DVT Evaluation: No evidence of  DVT seen on physical exam. Negative Homan's sign. Labs: Lab Results  Component Value Date   WBC 9.9 06/04/2018   HGB 7.9 (L) 06/04/2018   HCT 27.5 (L) 06/04/2018   MCV 76.6 (L) 06/04/2018   PLT 185 06/04/2018   No flowsheet data found.  Discharge instruction: per After Visit Summary and "Baby and Me Booklet".  After visit meds:  Allergies as of 06/06/2018   No Known Allergies     Medication List    STOP taking these medications   AMBULATORY NON FORMULARY MEDICATION   Blood Pressure Cuff Misc   docusate sodium 100 MG capsule Commonly known as:  Colace   ferrous sulfate 325 (65 FE) MG tablet Commonly known as:  FerrouSul   metroNIDAZOLE 500 MG tablet Commonly known as:  Flagyl   multivitamin-prenatal 27-0.8 MG Tabs tablet   terconazole 0.4 % vaginal cream Commonly known  as:  TERAZOL 7     TAKE these medications   acetaminophen 325 MG tablet Commonly known as:  Tylenol Take 2 tablets (650 mg total) by mouth every 4 (four) hours as needed for mild pain (for pain scale < 4).   benzocaine-Menthol 20-0.5 % Aero Commonly known as:  DERMOPLAST Apply 1 application topically as needed for irritation (perineal discomfort).   dibucaine 1 % Oint Commonly known as:  NUPERCAINAL Place 1 application rectally as needed for hemorrhoids.   ibuprofen 800 MG tablet Commonly known as:  ADVIL Take 1 tablet (800 mg total) by mouth every 8 (eight) hours as needed.   witch hazel-glycerin pad Commonly known as:  TUCKS Apply 1 application topically as needed for hemorrhoids.       Diet: No evidence of DVT seen on physical exam. Negative Homan's sign.  Activity: Advance as tolerated. Pelvic rest for 6 weeks.   Outpatient follow up:6 weeks Follow up Appt:  Follow up Visit: Message sent to clinic this morning.  Please schedule this patient for Postpartum visit in: 4 weeks with the following provider: Any provider For C/S patients schedule nurse incision check in weeks 2  weeks:  Low risk pregnancy complicated by: anemia needing IV iron Delivery mode:  VBAC Anticipated Birth Control:  other/unsure PP Procedures needed:   Schedule Integrated BH visit: no  Newborn Data: Live born female  Birth Weight:   APGAR: ,   Newborn Delivery   Birth date/time:  06/05/2018 00:27:00 Delivery type:  Vaginal, Spontaneous     Baby Feeding: Breast Disposition:home with mother  **Patient unsure about postpartum contraception, "probably pills". Notified this means her postpartum appointment will likely be a televisit. She is amenable to this format.  Clayton BiblesSamantha Miana Politte, CNM 06/06/18  10:14 AM

## 2018-06-06 ENCOUNTER — Inpatient Hospital Stay (HOSPITAL_COMMUNITY): Admit: 2018-06-06 | Payer: Medicaid Other

## 2018-06-06 MED ORDER — IBUPROFEN 800 MG PO TABS: 800.0000 mg | ORAL_TABLET | Freq: Three times a day (TID) | ORAL | 0 refills | Status: AC | PRN

## 2018-06-06 MED ORDER — BENZOCAINE-MENTHOL 20-0.5 % EX AERO: 1.0000 "application " | INHALATION_SPRAY | CUTANEOUS | 0 refills | Status: DC | PRN

## 2018-06-06 MED ORDER — WITCH HAZEL-GLYCERIN EX PADS: 1.0000 "application " | MEDICATED_PAD | CUTANEOUS | 12 refills | Status: DC | PRN

## 2018-06-06 MED ORDER — ACETAMINOPHEN 325 MG PO TABS: 650.0000 mg | ORAL_TABLET | ORAL | 0 refills | Status: DC | PRN

## 2018-06-06 MED ORDER — DIBUCAINE (PERIANAL) 1 % EX OINT: 1.0000 "application " | TOPICAL_OINTMENT | CUTANEOUS | 0 refills | Status: DC | PRN

## 2018-06-06 NOTE — Discharge Instructions (Signed)

## 2018-06-06 NOTE — Lactation Note (Signed)
This note was copied from a baby's chart. Lactation Consultation Note  Patient Name: Erin Montes [id number] Date: 06/06/2018 Reason for consult: Initial assessment;Term;Infant weight loss  Baby is 22 hours old / mom requesting early D/C  As LC entered the room/ baby latched on cradle position and per mom  Comfortable.  LC observed the latch / depth noted and swallows.  LC reviewed and updated the doc flow sheets per mom. Voids and stools QS for age.  Baby has been consistently breast feeding since birth 28 -40 mins - see doc flow sheets.  This an experienced breast feeder - per mom just stopped breast feeding in January and  He has fed for 1 1/2 years.  Mom mentioned her left nipple was alittle sore with latch. LC recommended prior to latch/  Breast massage / hand express/ reverse pressure as described to elongate the nipple /  Areola complex.  Per mom has a pump at home.  LC reviewed importance of STS feedings until the baby is back to birth weight/ gaining  Steadily and can stay awake for majority of feeding.  Discussed nutritive vs non - nutritive feeding patterns and the importance of watching the  Baby for hanging out latched, storage of breast milk.  Mom aware of the virtual BFSG/ breast feeding help line/ and LC O/P by appt.       Maternal Data Has patient been taught Hand Expression?: Yes Does the patient have breastfeeding experience prior to this delivery?: Yes  Feeding Feeding Type: (baby already latched )  LATCH Score Latch: (latched with depth )  Audible Swallowing: (swallows noted )     Comfort (Breast/Nipple): (per mom  comfortable )  Hold (Positioning): (per mom latched by her self )     Interventions Interventions: Breast feeding basics reviewed  Lactation Tools Discussed/Used WIC Program: No Pump Review: Milk Storage   Consult Status Consult Status: Complete    Matilde Sprang Aliese Brannum 06/06/2018, 11:58 AM

## 2018-06-06 NOTE — Progress Notes (Signed)
CSW received consult for late and limited PNC.  CSW reviewed chart and is screening out consult as it does not meet criteria for automatic CSW involvement and infant drug screening.  MOB started care prior to 28 weeks and had more than 3 visits.  Please contact CSW if current concerns arise or by MOB's request.    Isair Inabinet S. Saydie Gerdts, MSW, LCSW-A [facility] [phone] 

## 2018-06-09 ENCOUNTER — Other Ambulatory Visit: Payer: Medicaid Other

## 2018-06-09 ENCOUNTER — Encounter: Payer: Medicaid Other | Admitting: Obstetrics & Gynecology

## 2018-06-12 ENCOUNTER — Inpatient Hospital Stay (HOSPITAL_COMMUNITY): Payer: Medicaid Other

## 2018-06-13 ENCOUNTER — Encounter: Payer: Medicaid - Out of State | Admitting: Family Medicine

## 2018-06-13 ENCOUNTER — Other Ambulatory Visit: Payer: Medicaid - Out of State

## 2018-07-08 ENCOUNTER — Other Ambulatory Visit: Payer: Self-pay

## 2018-07-08 ENCOUNTER — Ambulatory Visit (INDEPENDENT_AMBULATORY_CARE_PROVIDER_SITE_OTHER): Payer: Medicaid Other | Admitting: Obstetrics & Gynecology

## 2018-07-08 NOTE — Progress Notes (Signed)
I connected with  Erin Montes on 07/08/18 at  9:35 AM EDT by telephone and verified that I am speaking with the correct person using two identifiers.   I discussed the limitations, risks, security and privacy concerns of performing an evaluation and management service by telephone and the availability of in person appointments. I also discussed with the patient that there may be a patient responsible charge related to this service. The patient expressed understanding and agreed to proceed.  [name], CMA 07/08/2018  9:46 AM

## 2018-07-08 NOTE — Progress Notes (Signed)
TELEHEALTH POSTPARTUM VIRTUAL VIDEO VISIT ENCOUNTER NOTE  Provider location: [facility] [employer] [facility]   I connected with Erin Montes on 07/08/18 at  9:35 AM EDT by WebEx Encounter at home and verified that I am speaking with the correct person using two identifiers.    I discussed the limitations, risks, security and privacy concerns of performing an evaluation and management service by telephone and the availability of in person appointments. I also discussed with the patient that there may be a patient responsible charge related to this service. The patient expressed understanding and agreed to proceed.  Appointment Date: 07/08/2018  Chief Complaint: Postpartum Visit  History of Present Illness: Erin Montes is a 23 y.o. African-American G3P2002 being evaluated for postpartum followup.    She is s/p VBAC at 40 weeks; she was discharged to home on PPD #2 Pregnancy complicated by none. Baby is doing well.    Vaginal bleeding or discharge: No  Intercourse: No  Contraception: no method Mode of feeding infant: Breast PP depression s/s: no Any bowel or bladder issues: No  Pap smear: in [place] but she cannot remember when it was  Review of Systems:  Her 12 point review of systems is negative or as noted in the History of Present Illness.  Patient Active Problem List   Diagnosis Date Noted  . PROM (premature rupture of membranes) 06/05/2018  . Pregnancy with history of cesarean section, antepartum 05/27/2018    Medications Erin Montes had no medications administered during this visit. Current Outpatient Medications  Medication Sig Dispense Refill  . acetaminophen (TYLENOL) 325 MG tablet Take 2 tablets (650 mg total) by mouth every 4 (four) hours as needed for mild pain (for pain scale < 4). (Patient not taking: Reported on 07/08/2018) 300 tablet 0  . benzocaine-Menthol (DERMOPLAST) 20-0.5 % AERO Apply 1 application topically as needed for irritation  (perineal discomfort). (Patient not taking: Reported on 07/08/2018) 56 g 0  . dibucaine (NUPERCAINAL) 1 % OINT Place 1 application rectally as needed for hemorrhoids. (Patient not taking: Reported on 07/08/2018) 28 g 0  . ibuprofen (ADVIL) 800 MG tablet Take 1 tablet (800 mg total) by mouth every 8 (eight) hours as needed. (Patient not taking: Reported on 07/08/2018) 30 tablet 0  . witch hazel-glycerin (TUCKS) pad Apply 1 application topically as needed for hemorrhoids. (Patient not taking: Reported on 07/08/2018) 40 each 12   No current facility-administered medications for this visit.     Allergies Patient has no known allergies.  Physical Exam:  LMP 08/29/2017 (Exact Date)  Reviewed from [facility] General:  Alert, oriented and cooperative. Patient is in no acute distress.  Mental Status: Normal mood and affect. Normal behavior. Normal judgment and thought content.   Respiratory: Normal respiratory effort noted, no problems with respiration noted  Rest of physical exam deferred due to type of encounter  PP Depression Screening:   Edinburgh Postnatal Depression Scale - 07/08/18 0949      Edinburgh Postnatal Depression Scale:  In the Past 7 Days   I have looked forward with enjoyment to things.  0    I have blamed myself unnecessarily when things went wrong.  0    I have been anxious or worried for no good reason.  0    I have felt scared or panicky for no good reason.  0    Things have been getting on top of me.  0    I have been so unhappy that I  have had difficulty sleeping.  0    I have felt sad or miserable.  0    I have been so unhappy that I have been crying.  0    The thought of harming myself has occurred to me.  0       Assessment:Patient is a 23 y.o. [id number] who is 4 weeks postpartum from a VBAC (second one).  She is doing well.   Plan:  Postpartum state- doing well Contraception- withdrawal/nothing per her choice Preventative care- she will schedule an annual  exam   I discussed the assessment and treatment plan with the patient. The patient was provided an opportunity to ask questions and all were answered. The patient agreed with the plan and demonstrated an understanding of the instructions.   The patient was advised to call back or seek an in-person evaluation/go to the ED for any concerning postpartum symptoms.  I provided 10 minutes of face-to-face time during this encounter.   Allie BossierMyra C Zahi Plaskett, MD [facility], [facility]

## 2018-08-28 ENCOUNTER — Encounter (HOSPITAL_COMMUNITY): Payer: Self-pay

## 2018-08-31 ENCOUNTER — Telehealth: Payer: Self-pay | Admitting: Obstetrics & Gynecology

## 2018-08-31 NOTE — Telephone Encounter (Signed)
Attempted to call patient with her annual exam appointment (8/21 @ 8:15). No answer, left voicemail with appointment information. Patient instructed to give the office a call back if she is needing to reschedule. Appointment reminder mailed.

## 2018-09-29 ENCOUNTER — Telehealth: Payer: Self-pay | Admitting: Medical

## 2018-09-29 ENCOUNTER — Telehealth: Payer: Self-pay | Admitting: Obstetrics and Gynecology

## 2018-09-29 NOTE — Telephone Encounter (Signed)
Attempted to reach patient about her appointment. Left a message for her to call us back.

## 2018-09-29 NOTE — Telephone Encounter (Signed)
Called the patient to inform of the covid19 restrictions. The patient stated she would like to reschedule the appointment.

## 2018-09-30 ENCOUNTER — Ambulatory Visit: Payer: Medicaid Other | Admitting: Medical

## 2018-09-30 ENCOUNTER — Ambulatory Visit: Payer: Medicaid Other | Admitting: Obstetrics & Gynecology

## 2018-10-10 ENCOUNTER — Telehealth: Payer: Self-pay | Admitting: Family Medicine

## 2018-10-10 NOTE — Telephone Encounter (Signed)
Called the patient to confirm the upcoming appointment. Informed the patient of no visitors or children due to covid restrictions. The patient answered no to all of the screening questions. Informed the patient of the new location. The patient verbalized understanding. °

## 2018-10-11 ENCOUNTER — Encounter: Payer: Self-pay | Admitting: Family Medicine

## 2018-10-11 ENCOUNTER — Ambulatory Visit: Payer: Medicaid Other | Admitting: Medical

## 2019-04-04 ENCOUNTER — Ambulatory Visit: Payer: Medicaid Other | Admitting: Medical

## 2019-05-04 ENCOUNTER — Ambulatory Visit: Payer: Medicaid Other | Admitting: Medical

## 2019-05-05 ENCOUNTER — Encounter: Payer: Self-pay | Admitting: Medical

## 2019-05-05 ENCOUNTER — Other Ambulatory Visit: Payer: Self-pay

## 2019-05-05 ENCOUNTER — Other Ambulatory Visit (HOSPITAL_COMMUNITY)
Admission: RE | Admit: 2019-05-05 | Discharge: 2019-05-05 | Disposition: A | Discharge status: Home / Self Care | Payer: Medicaid Other | Source: Ambulatory Visit | Attending: Medical | Admitting: Medical

## 2019-05-05 ENCOUNTER — Ambulatory Visit (INDEPENDENT_AMBULATORY_CARE_PROVIDER_SITE_OTHER): Payer: Medicaid Other | Admitting: Medical

## 2019-05-05 VITALS — BP 123/72 | HR 97 | Ht 64.0 in | Wt 180.8 lb

## 2019-05-05 DIAGNOSIS — Z01419 Encounter for gynecological examination (general) (routine) without abnormal findings: Secondary | ICD-10-CM | POA: Insufficient documentation

## 2019-05-05 LAB — POCT URINALYSIS DIP (DEVICE)
Bilirubin Urine: NEGATIVE
Glucose, UA: NEGATIVE mg/dL
Hgb urine dipstick: NEGATIVE
Ketones, ur: NEGATIVE mg/dL
Leukocytes,Ua: NEGATIVE
Nitrite: NEGATIVE
Protein, ur: NEGATIVE mg/dL
Specific Gravity, Urine: >=1.03 (ref 1.005–1.030)
Urobilinogen, UA: 0.2 mg/dL (ref 0.0–1.0)
pH: 7 (ref 5.0–8.0)

## 2019-05-05 NOTE — Patient Instructions (Signed)

## 2019-05-05 NOTE — Progress Notes (Signed)
  History:  [name] is a 24 y.o. [phone] who presents to clinic today for annual exam with pap smear. She states occasional mild pelvic pain and requests testing for UTI. She denies dysuria, frequency or urgency. She denies abnormal discharge, bleeding. She states regular periods. She is not currently on birth control and does not wish to be on birth control at this time. She does not want another pregnancy.    The following portions of the patient's history were reviewed and updated as appropriate: allergies, current medications, family history, past medical history, social history, past surgical history and problem list.  Review of Systems:  Review of Systems  Constitutional: Negative for fever.  Gastrointestinal: Positive for abdominal pain.  Genitourinary: Negative for dysuria, flank pain, frequency, hematuria and urgency.       Neg - discharge, bleeding      Objective:  Physical Exam BP 123/72   Pulse 97   Ht 5\' 4"  (1.626 m)   Wt 180 lb 12.8 oz (82 kg)   LMP 04/09/2019   BMI 31.03 kg/m  Physical Exam  Nursing note and vitals reviewed. Constitutional: She is oriented to person, place, and time. She appears well-developed and well-nourished. No distress.  HENT:  Head: Normocephalic and atraumatic.  Eyes: Conjunctivae and EOM are normal.  Neck: No thyromegaly present.  Cardiovascular: Normal rate, regular rhythm and normal heart sounds.  No murmur heard. Respiratory: Effort normal and breath sounds normal. No respiratory distress. She has no wheezes.  GI: Soft. Bowel sounds are normal. She exhibits no distension and no mass. There is no abdominal tenderness. There is no rebound and no guarding.  Genitourinary: Uterus is not enlarged and not tender. Cervix exhibits no motion tenderness, no discharge and no friability. Right adnexum displays no mass and no tenderness. Left adnexum displays tenderness (mild). Left adnexum displays no mass.    Vaginal discharge (small,  thick, white) present.     No vaginal bleeding.  No bleeding in the vagina.  Musculoskeletal:     Cervical back: Normal range of motion and neck supple.  Neurological: She is alert and oriented to person, place, and time.  Skin: Skin is warm and dry. No erythema.  Psychiatric: She has a normal mood and affect.    Labs and Imaging 04/11/2019 today with trace protein, otherwise unremarkable   Assessment & Plan:  1. Women's annual routine gynecological examination - Cytology - PAP( [place]) - Cervicovaginal ancillary only( [place]) - Patient declined blood work for STD screening today  - patient declined flu shot  - If intermittent pelvic pain persists and no treatment is indicated based on labs from today, consider [place] - Follow-up in 1 year for annual exam or sooner PRN    [place] 05/05/2019 9:40 AM

## 2019-05-08 LAB — CYTOLOGY - PAP
Chlamydia: NEGATIVE
Comment: NEGATIVE
Comment: NORMAL
Diagnosis: NEGATIVE
Neisseria Gonorrhea: NEGATIVE

## 2019-05-08 LAB — CERVICOVAGINAL ANCILLARY ONLY
Bacterial Vaginitis (gardnerella): NEGATIVE
Candida Glabrata: NEGATIVE
Candida Vaginitis: NEGATIVE
Comment: NEGATIVE
Comment: NEGATIVE
Comment: NEGATIVE
Comment: NEGATIVE
Trichomonas: NEGATIVE

## 2019-05-09 ENCOUNTER — Ambulatory Visit: Payer: Medicaid Other | Admitting: Women's Health

## 2019-07-26 ENCOUNTER — Other Ambulatory Visit: Payer: Self-pay

## 2019-07-26 ENCOUNTER — Ambulatory Visit (INDEPENDENT_AMBULATORY_CARE_PROVIDER_SITE_OTHER): Payer: Medicaid Other

## 2019-07-26 DIAGNOSIS — R102 Pelvic and perineal pain: Secondary | ICD-10-CM | POA: Diagnosis not present

## 2019-07-26 DIAGNOSIS — R350 Frequency of micturition: Secondary | ICD-10-CM

## 2019-07-26 DIAGNOSIS — R8290 Unspecified abnormal findings in urine: Secondary | ICD-10-CM

## 2019-07-26 LAB — POCT URINALYSIS DIP (DEVICE)
Glucose, UA: NEGATIVE mg/dL
Ketones, ur: NEGATIVE mg/dL
Leukocytes,Ua: NEGATIVE
Nitrite: NEGATIVE
Protein, ur: 100 mg/dL — AB
Specific Gravity, Urine: >=1.03 (ref 1.005–1.030)
Urobilinogen, UA: 0.2 mg/dL (ref 0.0–1.0)
pH: 5 (ref 5.0–8.0)

## 2019-07-26 NOTE — Progress Notes (Signed)
Pt here today for possible UTI.  Pt reports urinary frequency and pelvic pain.  Pt has just started her period today.  Urinalysis showed protein 100 and small bilirubin.  I advised pt that I will send for urine cx and will tx if needed at that time.  I informed pt that it will take at least three days to get results and that we will call with abnormal results.    Addison Naegeli, RN  07/26/19

## 2019-07-27 LAB — URINE CULTURE

## 2019-08-03 NOTE — Progress Notes (Signed)
Patient was assessed and managed by nursing staff during this encounter. I have reviewed the chart and agree with the documentation and plan. I have also made any necessary editorial changes.  Dillinger Aston, MD 08/03/2019 3:00 PM   

## 2019-10-06 ENCOUNTER — Other Ambulatory Visit: Payer: Self-pay

## 2019-10-06 ENCOUNTER — Ambulatory Visit (INDEPENDENT_AMBULATORY_CARE_PROVIDER_SITE_OTHER): Payer: Medicaid Other | Admitting: Medical

## 2019-10-06 ENCOUNTER — Encounter: Payer: Self-pay | Admitting: Medical

## 2019-10-06 VITALS — BP 116/62 | HR 87 | Ht 64.0 in | Wt 189.3 lb

## 2019-10-06 DIAGNOSIS — R102 Pelvic and perineal pain: Secondary | ICD-10-CM

## 2019-10-06 NOTE — Progress Notes (Signed)
   History:  [name] is a 24 y.o. [id number] who presents to clinic today for evaluation of intermittent pelvic pain x 2 months. She states that pain is rated a 5/10 when it occurs. She notices the patient multiple times per day. She does still sometimes have pain with urination which was evaluated in June. Urine culture at that time was negative. She denies abnormal bleeding, discharge. She is sexually active with a long term partner. She has regular periods and LMP was 09/18/19. Last pap smear was normal 04/2019 and negative STD testing at that time.    The following portions of the patient's history were reviewed and updated as appropriate: allergies, current medications, family history, past medical history, social history, past surgical history and problem list.  Review of Systems:  Review of Systems  Constitutional: Negative for fever.  Gastrointestinal: Positive for abdominal pain.  Genitourinary: Negative for dysuria, frequency and urgency.       Neg - vaginal bleeding, discharge      Objective:  Physical Exam BP 116/62   Pulse 87   Ht 5\' 4"  (1.626 m)   Wt 189 lb 4.8 oz (85.9 kg)   LMP 09/18/2019 (Exact Date)   BMI 32.49 kg/m  Physical Exam Vitals and nursing note reviewed. Exam conducted with a chaperone present.  Constitutional:      General: She is not in acute distress.    Appearance: She is well-developed.  HENT:     Head: Normocephalic and atraumatic.  Cardiovascular:     Rate and Rhythm: Normal rate.  Pulmonary:     Effort: Pulmonary effort is normal.  Abdominal:     General: There is distension (mild suprapubic bilateral tenderness to palpation).     Palpations: Abdomen is soft. There is no mass.     Tenderness: There is no abdominal tenderness. There is no guarding or rebound.  Genitourinary:    General: Normal vulva.     Uterus: Normal.      Adnexa:        Right: Tenderness and fullness present. No mass.         Left: Tenderness present. No mass or  fullness.    Skin:    General: Skin is warm and dry.     Findings: No erythema.  Neurological:     Mental Status: She is alert and oriented to person, place, and time.     Assessment & Plan:  1. Pelvic pain - 11/18/2019 PELVIC COMPLETE WITH TRANSVAGINAL; Future - NSAIDs for pain PRN - Track pain for recurrent triggers - Will call patient with [place] results to discuss plan of care  Approximately 15 minutes of total time was spent with this patient on history taking, chart review, documentation and coordination of care.  [place], PA-C 10/06/2019 10:52 AM

## 2019-10-06 NOTE — Progress Notes (Signed)
Here for c/o intermittent  pelvic pain= 4-5 that started about a month ago. States happens sometime when urinating, walking, laying down, randomly Valkyrie Guardiola,RN

## 2019-10-06 NOTE — Patient Instructions (Signed)
Ovarian Cyst An ovarian cyst is a fluid-filled sac on an ovary. The ovaries are organs that make eggs in women. Most ovarian cysts go away on their own and are not cancerous (are benign). Some cysts need treatment. Follow these instructions at home:  Take over-the-counter and prescription medicines only as told by your doctor.  Do not drive or use heavy machinery while taking prescription pain medicine.  Get pelvic exams and Pap tests as often as told by your doctor.  Return to your normal activities as told by your doctor. Ask your doctor what activities are safe for you.  Do not use any products that contain nicotine or tobacco, such as cigarettes and e-cigarettes. If you need help quitting, ask your doctor.  Keep all follow-up visits as told by your doctor. This is important. Contact a doctor if:  Your periods are: ? Late. ? Irregular. ? Painful.   Your periods stop.  You have pelvic pain that does not go away.  You have pressure on your bladder.  You have trouble making your bladder empty when you pee (urinate).  You have pain during sex.  You have any of the following in your belly (abdomen): ? A feeling of fullness. ? Pressure. ? Discomfort. ? Pain that does not go away. ? Swelling.  You feel sick most of the time.  You have trouble pooping (have constipation).  You are not as hungry as usual (you lose your appetite).  You get very bad acne.  You start to have more hair on your body and face.  You are gaining weight or losing weight without changing your exercise and eating habits.  You think you may be pregnant. Get help right away if:  You have belly pain that is very bad or gets worse.  You cannot eat or drink without throwing up (vomiting).  You suddenly get a fever.  Your period is a lot heavier than usual. This information is not intended to replace advice given to you by your health care provider. Make sure you discuss any questions you have  with your health care provider. Document Revised: 01/08/2017 Document Reviewed: 06/30/2015 Elsevier Patient Education  2020 [employer]  

## 2019-10-12 ENCOUNTER — Ambulatory Visit: Admission: RE | Admit: 2019-10-12 | Payer: Medicaid Other | Source: Ambulatory Visit

## 2019-10-23 ENCOUNTER — Ambulatory Visit
Admission: RE | Admit: 2019-10-23 | Discharge: 2019-10-23 | Disposition: A | Discharge status: Home / Self Care | Payer: Medicaid - Out of State | Source: Ambulatory Visit | Attending: Medical | Admitting: Medical

## 2019-10-23 ENCOUNTER — Other Ambulatory Visit: Payer: Self-pay

## 2019-10-23 DIAGNOSIS — R102 Pelvic and perineal pain: Secondary | ICD-10-CM | POA: Diagnosis present

## 2022-04-14 IMAGING — US US PELVIS COMPLETE WITH TRANSVAGINAL
1 series · 15 of 25 positions shown · non-contrast
Comparison: None

CLINICAL DATA: Pelvic pain and adnexal fullness.



[Series 1: us pelvis complete with transvaginal · 15 of 98 slices shown]
[im 1/98]
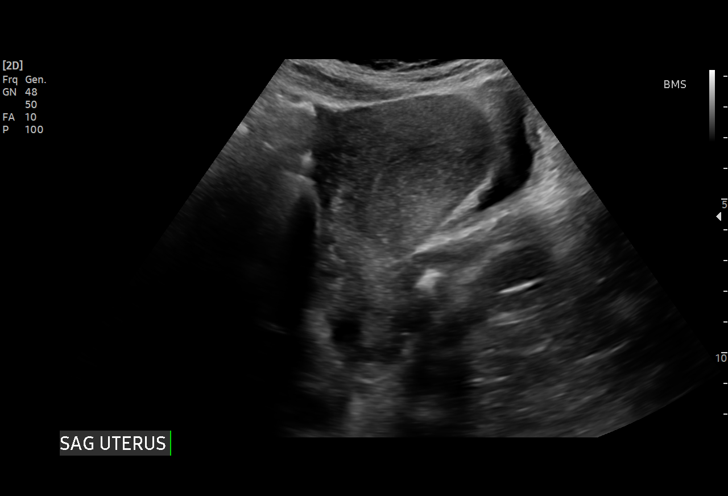
[im 9/98]
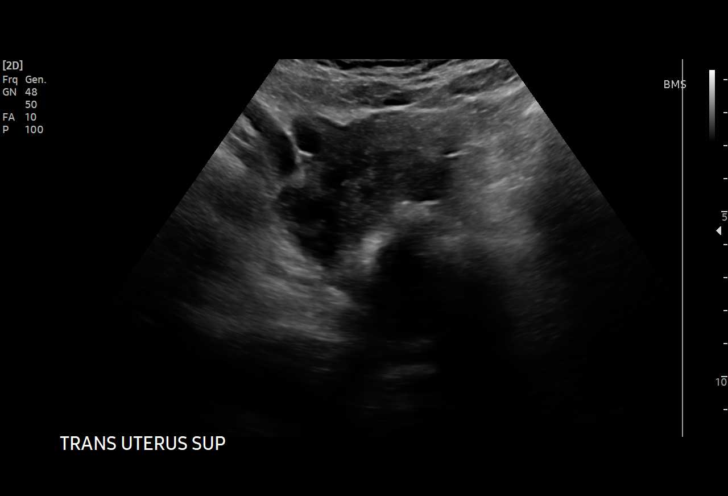
[im 17/98]
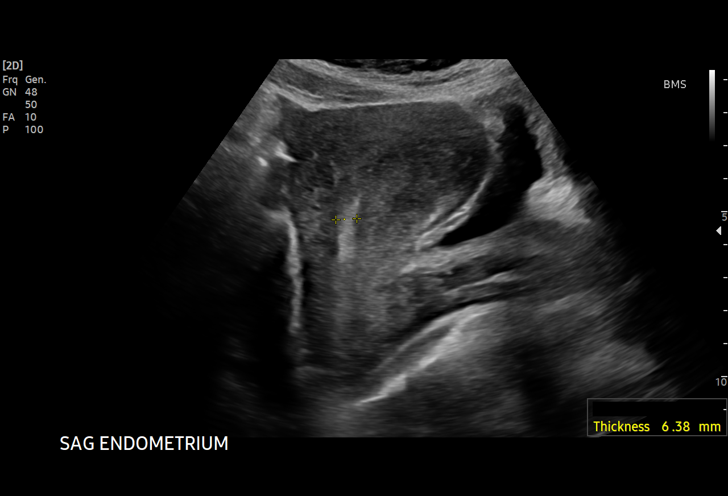
[im 21/98]
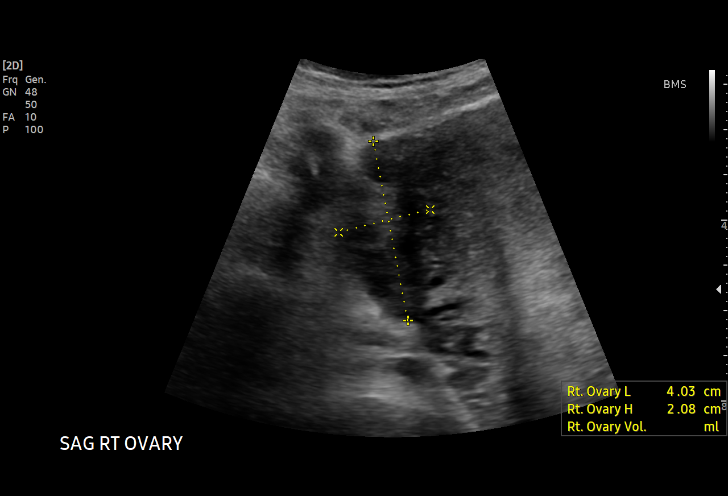
[im 29/98]
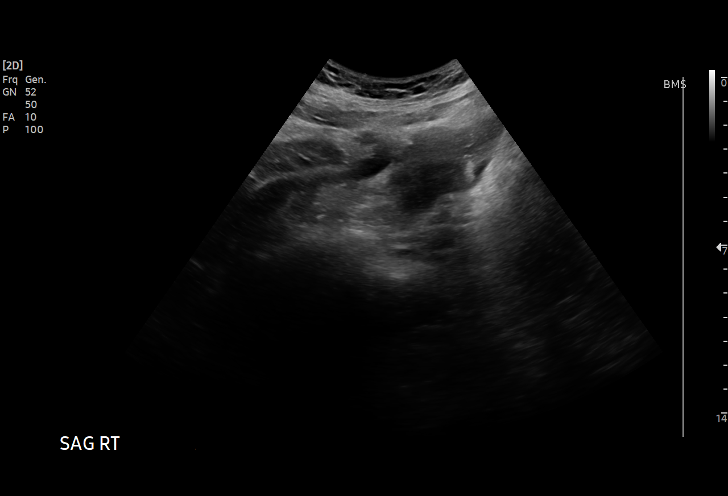
[im 37/98]
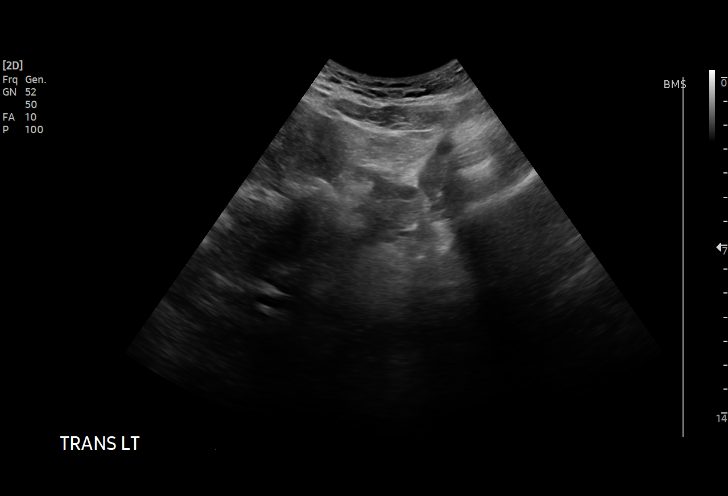
[im 41/98]
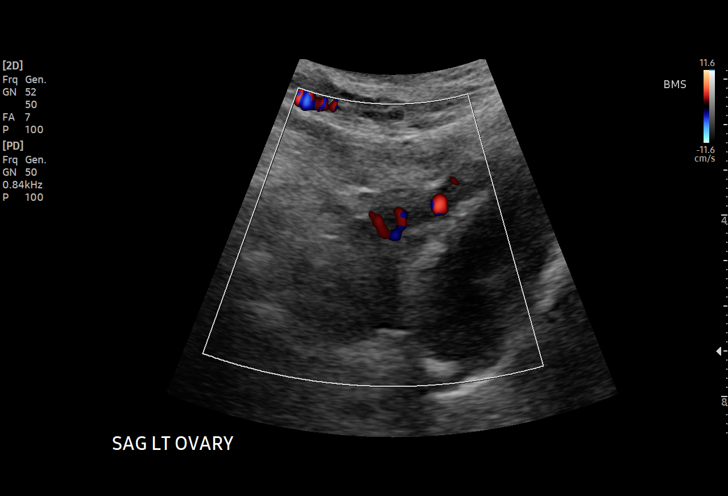
[im 49/98]
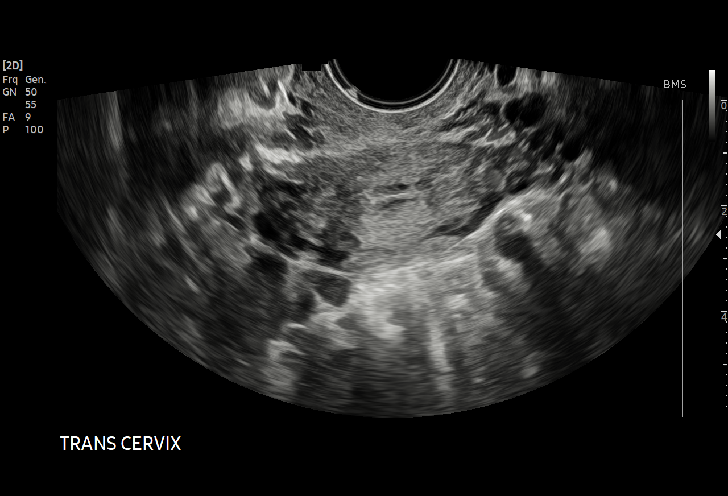
[im 57/98]
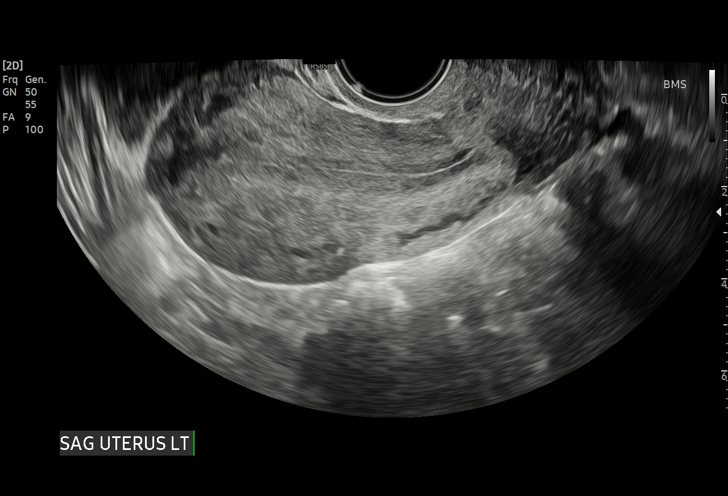
[im 61/98]
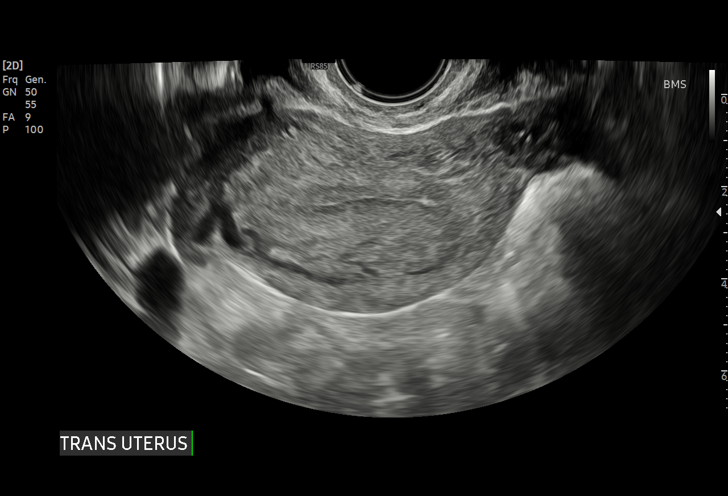
[im 69/98]
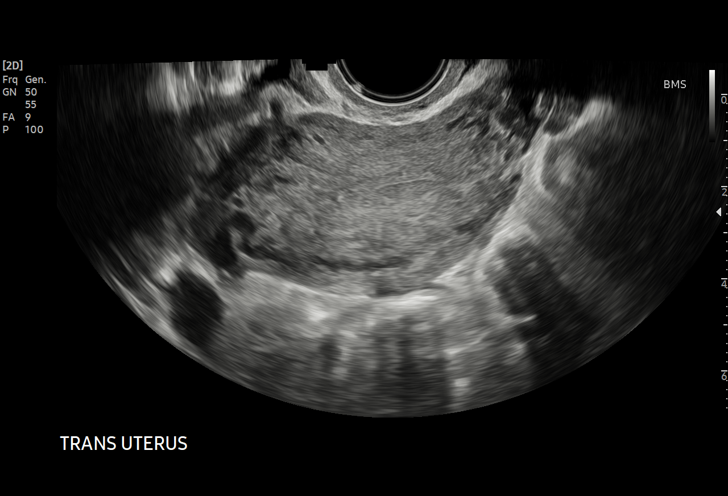
[im 77/98]
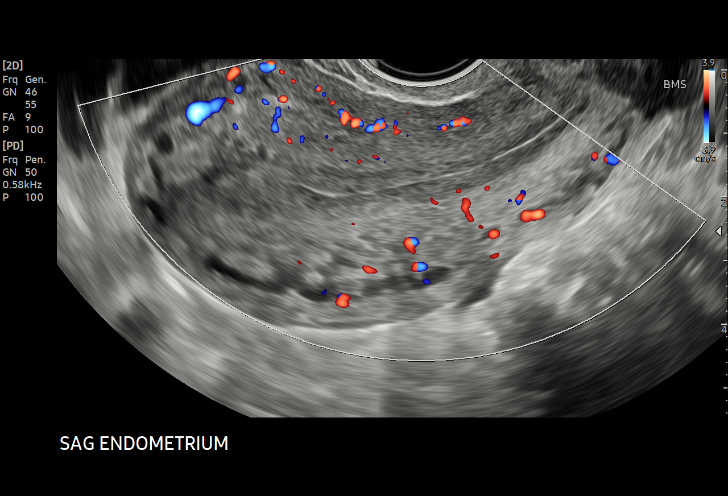
[im 81/98]
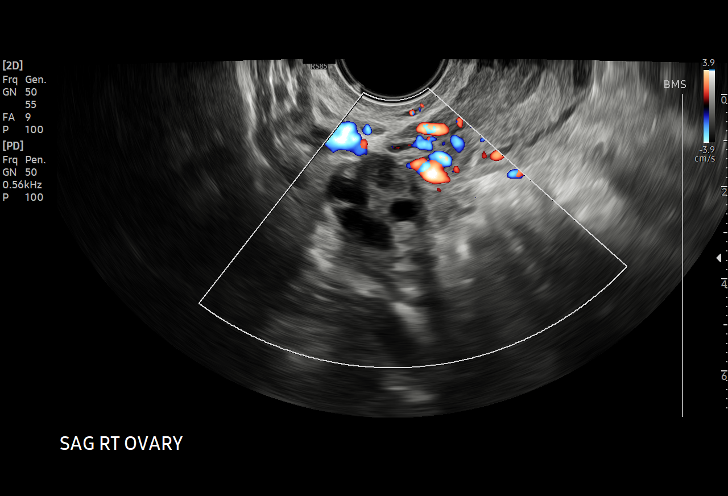
[im 89/98]
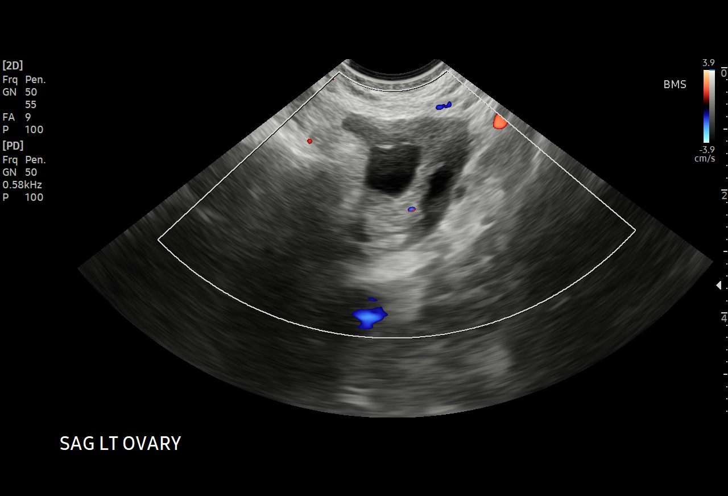
[im 98/98]
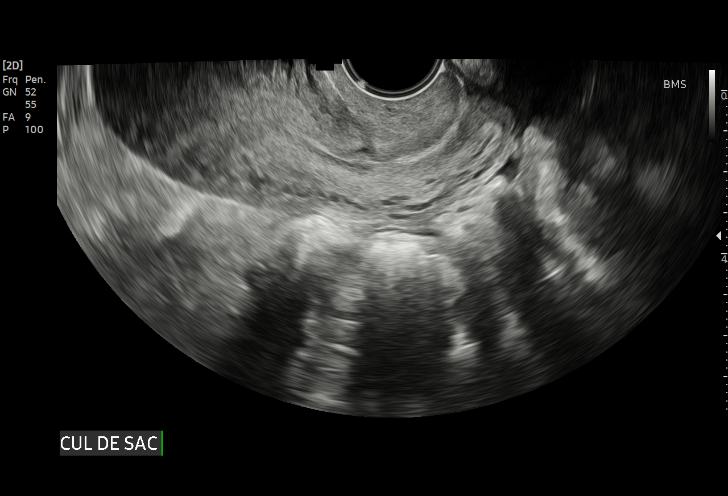

[15 of 25 positions shown; findings below may reference images not displayed]

FINDINGS: Uterus

Measurements: 7.0 x 4.9 x 6.7 cm = volume: 120 mL. No fibroids or
other mass visualized.

Endometrium

Thickness: 7 mm.  No focal abnormality visualized.

Right ovary

Measurements: 3.7 x 1.9 x 4.5 cm = volume: 17 mL. Normal
appearance/no adnexal mass.

Left ovary

Measurements: 3.1 x 1.7 x 3.4 cm = volume: 9.3 mL. Normal
appearance/no adnexal mass.

Other findings

No abnormal free fluid.
IMPRESSION: Negative pelvic ultrasound.
